# Patient Record
Sex: Male | Born: 1945 | Race: White | Hispanic: No | Marital: Single | Smoking: Never smoker
Health system: Southern US, Community
[De-identification: ages and names within clinical notes are randomized; demographics above are authoritative.]

---

## 2005-11-24 ENCOUNTER — Encounter: Admission: RE | Admit: 2005-11-24 | Discharge: 2005-11-24 | Payer: Self-pay | Admitting: Orthopaedic Surgery

## 2011-12-31 DIAGNOSIS — Z23 Encounter for immunization: Secondary | ICD-10-CM | POA: Diagnosis not present

## 2012-01-20 DIAGNOSIS — M7989 Other specified soft tissue disorders: Secondary | ICD-10-CM | POA: Diagnosis not present

## 2012-01-21 ENCOUNTER — Other Ambulatory Visit: Payer: Self-pay | Admitting: Family Medicine

## 2012-01-21 DIAGNOSIS — M7989 Other specified soft tissue disorders: Secondary | ICD-10-CM

## 2012-01-22 ENCOUNTER — Ambulatory Visit
Admission: RE | Admit: 2012-01-22 | Discharge: 2012-01-22 | Disposition: A | Discharge status: Home / Self Care | Payer: Medicare Other | Source: Ambulatory Visit | Attending: Family Medicine | Admitting: Family Medicine

## 2012-01-22 DIAGNOSIS — M7989 Other specified soft tissue disorders: Secondary | ICD-10-CM

## 2012-01-22 DIAGNOSIS — I771 Stricture of artery: Secondary | ICD-10-CM | POA: Diagnosis not present

## 2012-02-09 DIAGNOSIS — Z131 Encounter for screening for diabetes mellitus: Secondary | ICD-10-CM | POA: Diagnosis not present

## 2012-02-09 DIAGNOSIS — Z0000 Encounter for general adult medical examination without abnormal findings: Secondary | ICD-10-CM | POA: Diagnosis not present

## 2012-02-09 DIAGNOSIS — Z136 Encounter for screening for cardiovascular disorders: Secondary | ICD-10-CM | POA: Diagnosis not present

## 2012-02-09 DIAGNOSIS — Z23 Encounter for immunization: Secondary | ICD-10-CM | POA: Diagnosis not present

## 2012-02-09 DIAGNOSIS — Z125 Encounter for screening for malignant neoplasm of prostate: Secondary | ICD-10-CM | POA: Diagnosis not present

## 2012-02-16 DIAGNOSIS — Z1211 Encounter for screening for malignant neoplasm of colon: Secondary | ICD-10-CM | POA: Diagnosis not present

## 2012-10-12 DIAGNOSIS — H43819 Vitreous degeneration, unspecified eye: Secondary | ICD-10-CM | POA: Diagnosis not present

## 2012-12-15 DIAGNOSIS — Z23 Encounter for immunization: Secondary | ICD-10-CM | POA: Diagnosis not present

## 2013-03-16 DIAGNOSIS — Z1331 Encounter for screening for depression: Secondary | ICD-10-CM | POA: Diagnosis not present

## 2013-03-16 DIAGNOSIS — Z125 Encounter for screening for malignant neoplasm of prostate: Secondary | ICD-10-CM | POA: Diagnosis not present

## 2013-03-16 DIAGNOSIS — Z131 Encounter for screening for diabetes mellitus: Secondary | ICD-10-CM | POA: Diagnosis not present

## 2013-03-16 DIAGNOSIS — Z136 Encounter for screening for cardiovascular disorders: Secondary | ICD-10-CM | POA: Diagnosis not present

## 2013-03-16 DIAGNOSIS — Z0000 Encounter for general adult medical examination without abnormal findings: Secondary | ICD-10-CM | POA: Diagnosis not present

## 2013-03-31 DIAGNOSIS — R7301 Impaired fasting glucose: Secondary | ICD-10-CM | POA: Diagnosis not present

## 2013-04-29 IMAGING — US US EXTREM UP ARTERIAL SEG MULTIPLE*R*
2 series · 13 of 25 positions shown · non-contrast
Comparison: None.

CLINICAL DATA: Right arm swelling with pulsatile area involving the
distal aspect of the right arm, evaluate for
aneurysm/pseudoaneurysm

RIGHT UPPER EXTREMITY ARTERIAL DUPLEX SCAN
TECHNIQUE: Gray-scale sonography as well as color Doppler and
duplex ultrasound was performed to evaluate the arteries of the
right upper extremity.

[Series 1: us extrem up arterial seg multiple*right* · 12 of 59 slices shown (1 of 2)]
[im 1/59]
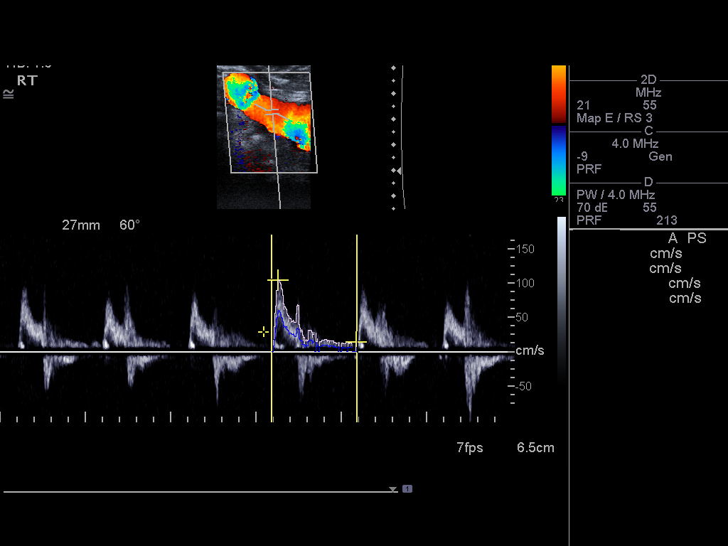
[im 6/59]
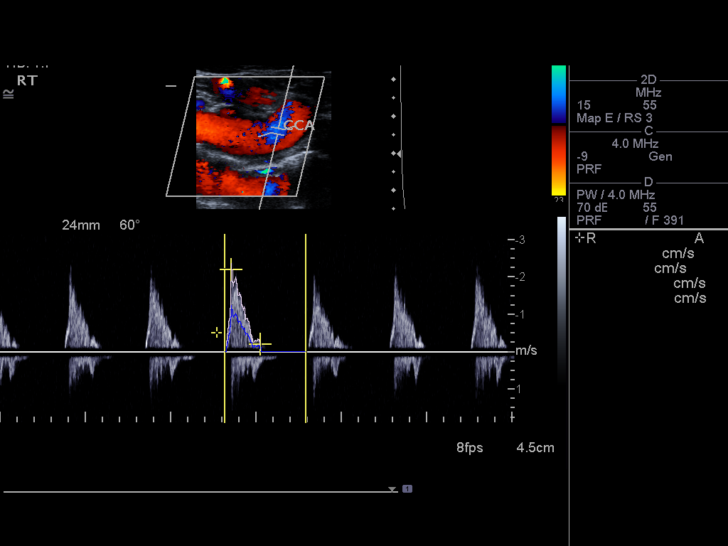
[im 11/59]
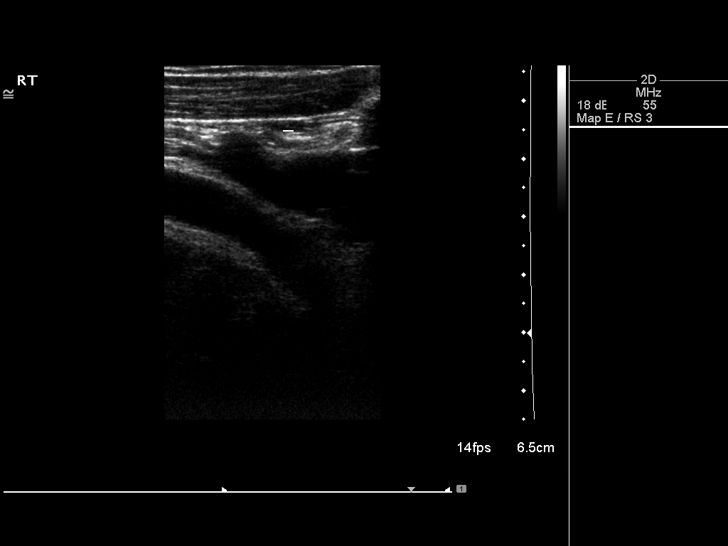
[im 16/59]
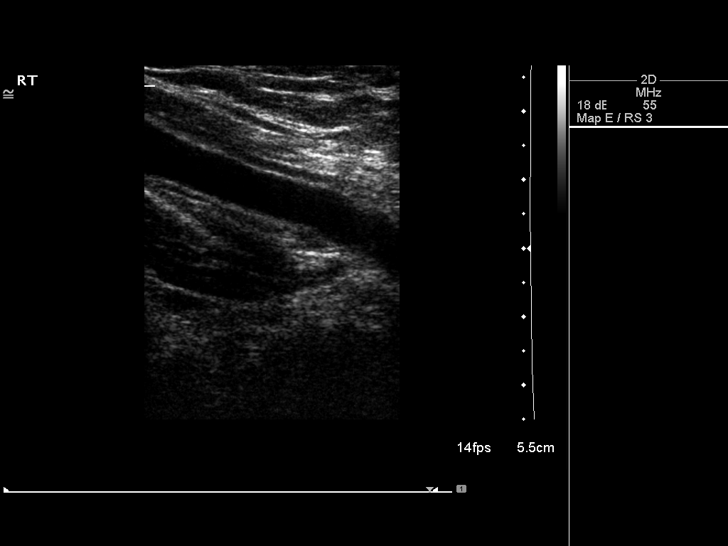
[im 21/59]
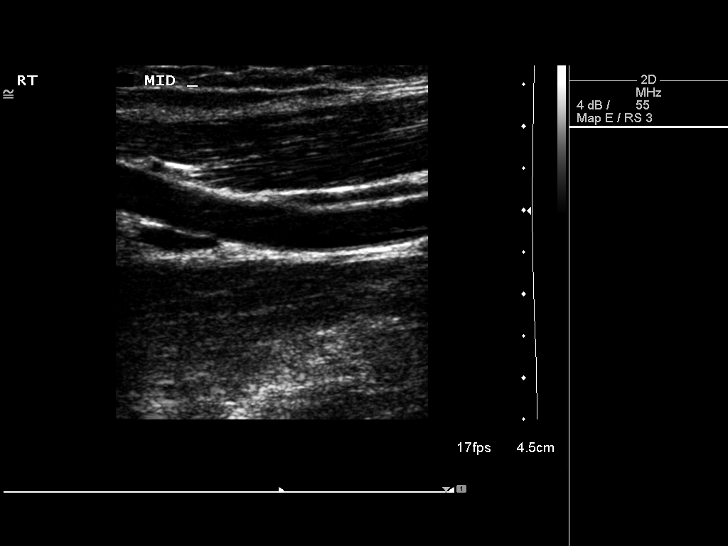
[im 26/59]
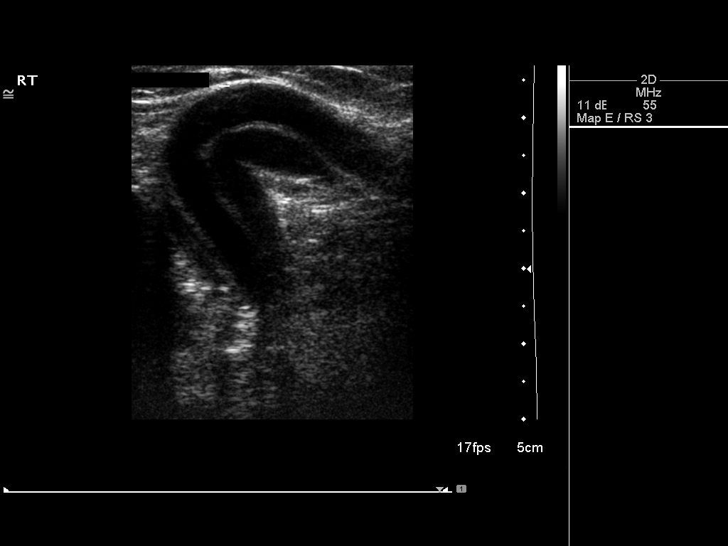
[im 31/59]
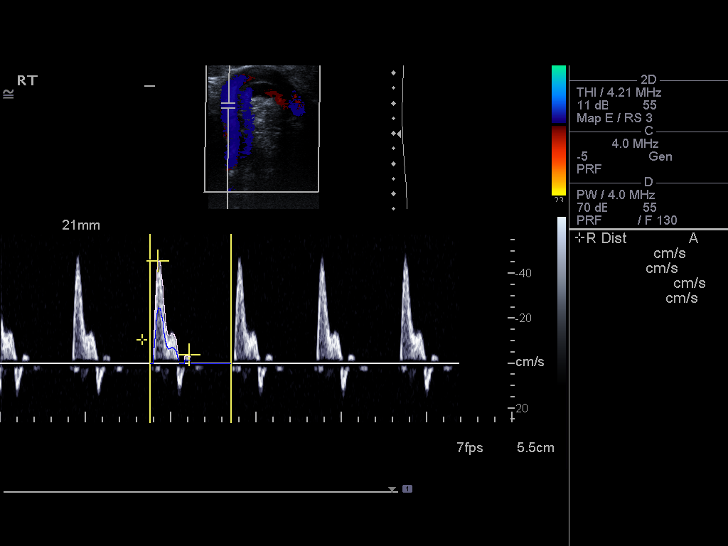
[im 36/59]
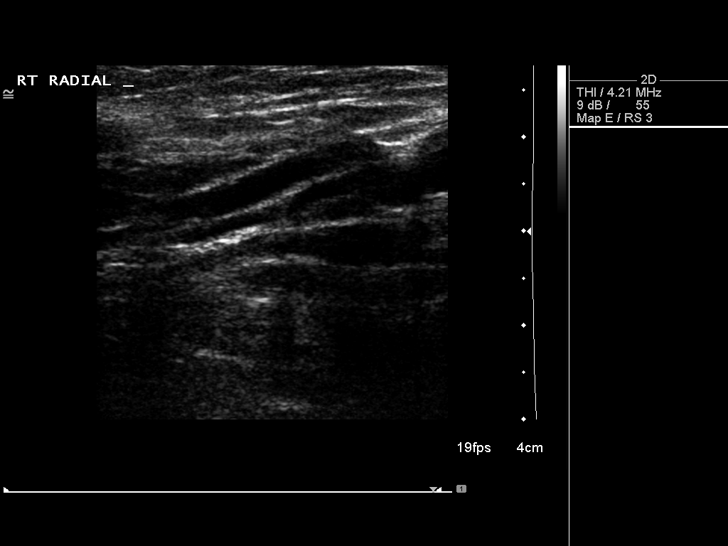
[im 41/59]
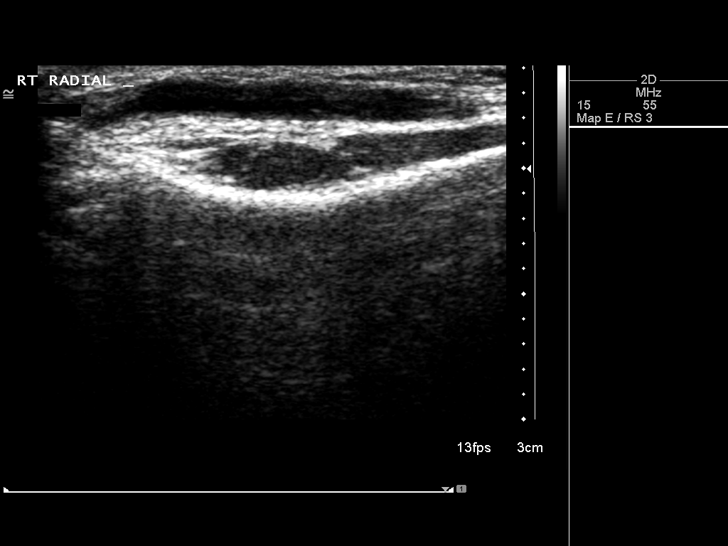
[im 46/59]
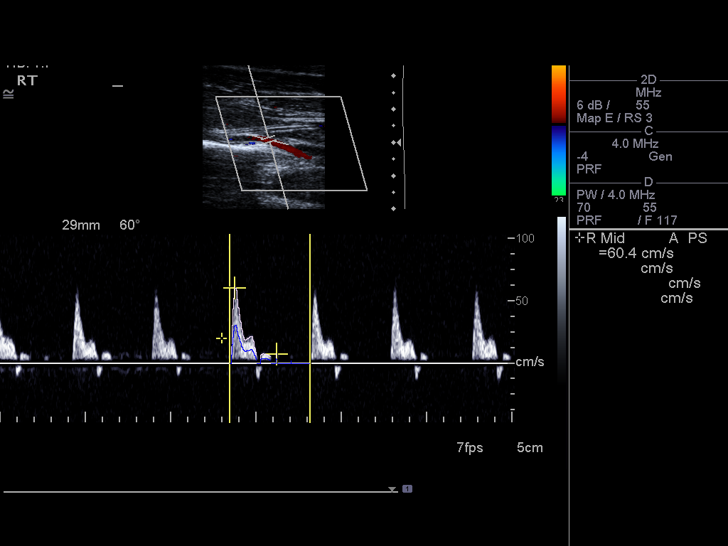
[im 51/59]
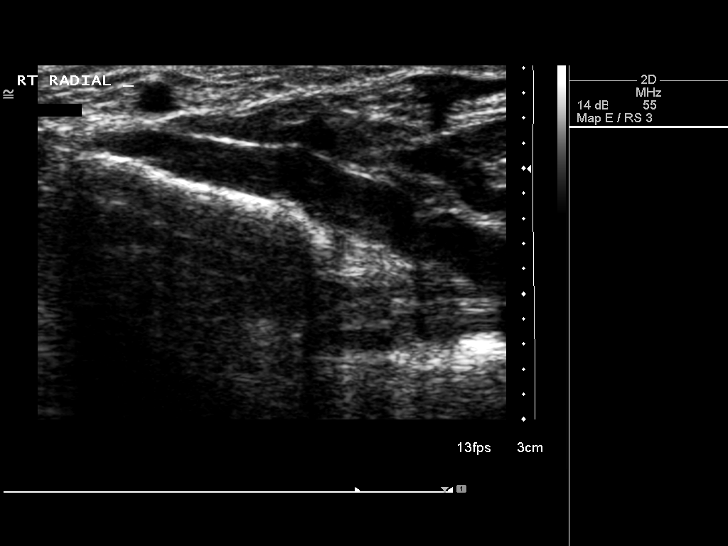
[im 56/59]
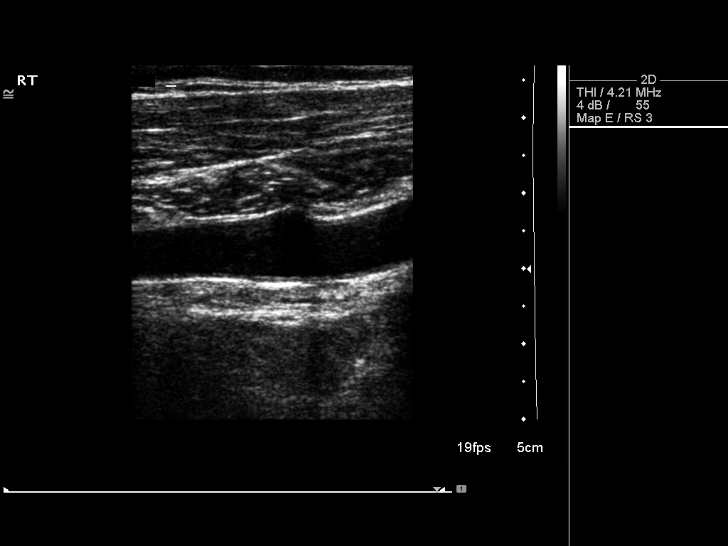

[Series 2: us extrem up arterial seg multiple*right* · 1 of 1 slices shown (2 of 2)]
[im 1/1]
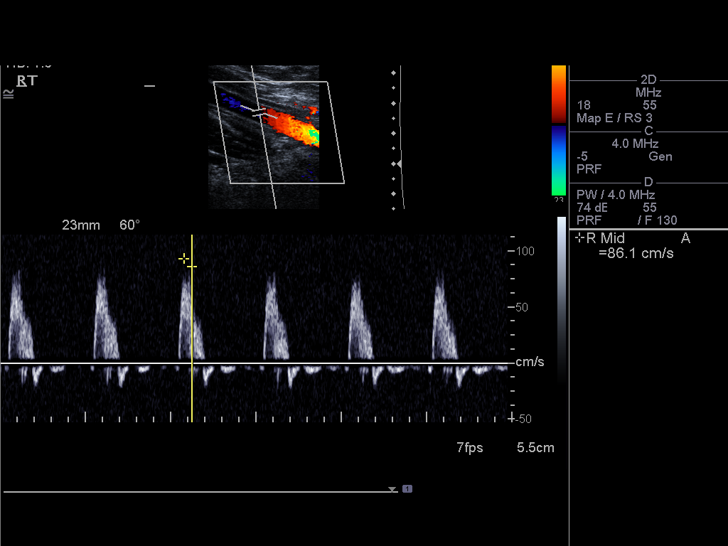

[13 of 25 positions shown; findings below may reference images not displayed]

FINDINGS: Triphasic wave forms are demonstrated throughout the entirety of
the irrigated portion of the right upper extremity beginning at the
level of the right subclavian artery.

Interrogated portions of the right upper extremity arteries are of
normal size.

There is tortuosity of the distal aspect of the brachial artery (at
the patient's palpable pulsatile area of concern), however the
vessel size is normal at this location.

No definitive evidence of vessel irregularity or dissection.
IMPRESSION: 1.  Normal Doppler ultrasound of the right upper extremity.
2.  Tortuosity of the distal aspect of the right brachial artery
correlates with the patient's palpable, pulsatile area of concern,
however the vessel is of normal caliber at this location.

## 2013-10-03 DIAGNOSIS — D485 Neoplasm of uncertain behavior of skin: Secondary | ICD-10-CM | POA: Diagnosis not present

## 2013-10-03 DIAGNOSIS — H6120 Impacted cerumen, unspecified ear: Secondary | ICD-10-CM | POA: Diagnosis not present

## 2013-10-06 DIAGNOSIS — L820 Inflamed seborrheic keratosis: Secondary | ICD-10-CM | POA: Diagnosis not present

## 2014-01-04 DIAGNOSIS — Z23 Encounter for immunization: Secondary | ICD-10-CM | POA: Diagnosis not present

## 2014-04-10 DIAGNOSIS — Z125 Encounter for screening for malignant neoplasm of prostate: Secondary | ICD-10-CM | POA: Diagnosis not present

## 2014-04-10 DIAGNOSIS — Z23 Encounter for immunization: Secondary | ICD-10-CM | POA: Diagnosis not present

## 2014-04-10 DIAGNOSIS — Z136 Encounter for screening for cardiovascular disorders: Secondary | ICD-10-CM | POA: Diagnosis not present

## 2014-04-10 DIAGNOSIS — Z0000 Encounter for general adult medical examination without abnormal findings: Secondary | ICD-10-CM | POA: Diagnosis not present

## 2014-04-10 DIAGNOSIS — D229 Melanocytic nevi, unspecified: Secondary | ICD-10-CM | POA: Diagnosis not present

## 2014-04-10 DIAGNOSIS — Z131 Encounter for screening for diabetes mellitus: Secondary | ICD-10-CM | POA: Diagnosis not present

## 2014-04-14 DIAGNOSIS — Z1211 Encounter for screening for malignant neoplasm of colon: Secondary | ICD-10-CM | POA: Diagnosis not present

## 2014-04-14 DIAGNOSIS — Z0000 Encounter for general adult medical examination without abnormal findings: Secondary | ICD-10-CM | POA: Diagnosis not present

## 2014-05-10 DIAGNOSIS — D225 Melanocytic nevi of trunk: Secondary | ICD-10-CM | POA: Diagnosis not present

## 2014-05-10 DIAGNOSIS — L814 Other melanin hyperpigmentation: Secondary | ICD-10-CM | POA: Diagnosis not present

## 2014-05-10 DIAGNOSIS — L821 Other seborrheic keratosis: Secondary | ICD-10-CM | POA: Diagnosis not present

## 2014-07-18 DIAGNOSIS — L821 Other seborrheic keratosis: Secondary | ICD-10-CM | POA: Diagnosis not present

## 2014-10-16 DIAGNOSIS — H01001 Unspecified blepharitis right upper eyelid: Secondary | ICD-10-CM | POA: Diagnosis not present

## 2014-10-16 DIAGNOSIS — H01004 Unspecified blepharitis left upper eyelid: Secondary | ICD-10-CM | POA: Diagnosis not present

## 2015-01-08 DIAGNOSIS — H01009 Unspecified blepharitis unspecified eye, unspecified eyelid: Secondary | ICD-10-CM | POA: Diagnosis not present

## 2015-01-11 DIAGNOSIS — H0011 Chalazion right upper eyelid: Secondary | ICD-10-CM | POA: Diagnosis not present

## 2015-02-12 DIAGNOSIS — H00032 Abscess of right lower eyelid: Secondary | ICD-10-CM | POA: Diagnosis not present

## 2015-03-07 DIAGNOSIS — H00032 Abscess of right lower eyelid: Secondary | ICD-10-CM | POA: Diagnosis not present

## 2015-06-14 DIAGNOSIS — H00034 Abscess of left upper eyelid: Secondary | ICD-10-CM | POA: Diagnosis not present

## 2015-06-28 DIAGNOSIS — D485 Neoplasm of uncertain behavior of skin: Secondary | ICD-10-CM | POA: Diagnosis not present

## 2015-06-28 DIAGNOSIS — L812 Freckles: Secondary | ICD-10-CM | POA: Diagnosis not present

## 2015-06-28 DIAGNOSIS — L570 Actinic keratosis: Secondary | ICD-10-CM | POA: Diagnosis not present

## 2015-06-28 DIAGNOSIS — D235 Other benign neoplasm of skin of trunk: Secondary | ICD-10-CM | POA: Diagnosis not present

## 2015-06-28 DIAGNOSIS — L821 Other seborrheic keratosis: Secondary | ICD-10-CM | POA: Diagnosis not present

## 2015-06-28 DIAGNOSIS — D1801 Hemangioma of skin and subcutaneous tissue: Secondary | ICD-10-CM | POA: Diagnosis not present

## 2015-06-28 DIAGNOSIS — D0339 Melanoma in situ of other parts of face: Secondary | ICD-10-CM | POA: Diagnosis not present

## 2015-07-09 DIAGNOSIS — H0014 Chalazion left upper eyelid: Secondary | ICD-10-CM | POA: Diagnosis not present

## 2015-07-19 DIAGNOSIS — E7800 Pure hypercholesterolemia, unspecified: Secondary | ICD-10-CM | POA: Diagnosis not present

## 2015-07-19 DIAGNOSIS — Z125 Encounter for screening for malignant neoplasm of prostate: Secondary | ICD-10-CM | POA: Diagnosis not present

## 2015-07-19 DIAGNOSIS — Z0000 Encounter for general adult medical examination without abnormal findings: Secondary | ICD-10-CM | POA: Diagnosis not present

## 2015-07-19 DIAGNOSIS — Z131 Encounter for screening for diabetes mellitus: Secondary | ICD-10-CM | POA: Diagnosis not present

## 2015-07-26 DIAGNOSIS — Z1211 Encounter for screening for malignant neoplasm of colon: Secondary | ICD-10-CM | POA: Diagnosis not present

## 2015-08-16 DIAGNOSIS — D2239 Melanocytic nevi of other parts of face: Secondary | ICD-10-CM | POA: Diagnosis not present

## 2015-08-16 DIAGNOSIS — D0339 Melanoma in situ of other parts of face: Secondary | ICD-10-CM | POA: Diagnosis not present

## 2015-10-23 DIAGNOSIS — H43393 Other vitreous opacities, bilateral: Secondary | ICD-10-CM | POA: Diagnosis not present

## 2015-12-11 DIAGNOSIS — Z23 Encounter for immunization: Secondary | ICD-10-CM | POA: Diagnosis not present

## 2016-04-11 DIAGNOSIS — L723 Sebaceous cyst: Secondary | ICD-10-CM | POA: Diagnosis not present

## 2016-06-19 DIAGNOSIS — D1800 Hemangioma unspecified site: Secondary | ICD-10-CM | POA: Diagnosis not present

## 2016-06-19 DIAGNOSIS — L821 Other seborrheic keratosis: Secondary | ICD-10-CM | POA: Diagnosis not present

## 2016-06-19 DIAGNOSIS — L814 Other melanin hyperpigmentation: Secondary | ICD-10-CM | POA: Diagnosis not present

## 2016-06-19 DIAGNOSIS — Z85820 Personal history of malignant melanoma of skin: Secondary | ICD-10-CM | POA: Diagnosis not present

## 2016-07-24 DIAGNOSIS — E7800 Pure hypercholesterolemia, unspecified: Secondary | ICD-10-CM | POA: Diagnosis not present

## 2016-07-24 DIAGNOSIS — R209 Unspecified disturbances of skin sensation: Secondary | ICD-10-CM | POA: Diagnosis not present

## 2016-07-24 DIAGNOSIS — Z0000 Encounter for general adult medical examination without abnormal findings: Secondary | ICD-10-CM | POA: Diagnosis not present

## 2016-07-24 DIAGNOSIS — Z125 Encounter for screening for malignant neoplasm of prostate: Secondary | ICD-10-CM | POA: Diagnosis not present

## 2016-07-24 DIAGNOSIS — Z1159 Encounter for screening for other viral diseases: Secondary | ICD-10-CM | POA: Diagnosis not present

## 2016-07-31 DIAGNOSIS — Z0000 Encounter for general adult medical examination without abnormal findings: Secondary | ICD-10-CM | POA: Diagnosis not present

## 2016-07-31 DIAGNOSIS — Z125 Encounter for screening for malignant neoplasm of prostate: Secondary | ICD-10-CM | POA: Diagnosis not present

## 2016-10-28 DIAGNOSIS — H2513 Age-related nuclear cataract, bilateral: Secondary | ICD-10-CM | POA: Diagnosis not present

## 2016-12-18 DIAGNOSIS — L821 Other seborrheic keratosis: Secondary | ICD-10-CM | POA: Diagnosis not present

## 2016-12-18 DIAGNOSIS — D229 Melanocytic nevi, unspecified: Secondary | ICD-10-CM | POA: Diagnosis not present

## 2016-12-18 DIAGNOSIS — L814 Other melanin hyperpigmentation: Secondary | ICD-10-CM | POA: Diagnosis not present

## 2016-12-18 DIAGNOSIS — Z85820 Personal history of malignant melanoma of skin: Secondary | ICD-10-CM | POA: Diagnosis not present

## 2016-12-18 DIAGNOSIS — L820 Inflamed seborrheic keratosis: Secondary | ICD-10-CM | POA: Diagnosis not present

## 2016-12-25 DIAGNOSIS — Z23 Encounter for immunization: Secondary | ICD-10-CM | POA: Diagnosis not present

## 2017-06-26 DIAGNOSIS — B359 Dermatophytosis, unspecified: Secondary | ICD-10-CM | POA: Diagnosis not present

## 2017-07-09 DIAGNOSIS — D225 Melanocytic nevi of trunk: Secondary | ICD-10-CM | POA: Diagnosis not present

## 2017-07-09 DIAGNOSIS — Z85820 Personal history of malignant melanoma of skin: Secondary | ICD-10-CM | POA: Diagnosis not present

## 2017-07-09 DIAGNOSIS — L821 Other seborrheic keratosis: Secondary | ICD-10-CM | POA: Diagnosis not present

## 2017-07-09 DIAGNOSIS — L814 Other melanin hyperpigmentation: Secondary | ICD-10-CM | POA: Diagnosis not present

## 2017-07-30 DIAGNOSIS — Z79899 Other long term (current) drug therapy: Secondary | ICD-10-CM | POA: Diagnosis not present

## 2017-07-30 DIAGNOSIS — Z0000 Encounter for general adult medical examination without abnormal findings: Secondary | ICD-10-CM | POA: Diagnosis not present

## 2017-07-30 DIAGNOSIS — E7800 Pure hypercholesterolemia, unspecified: Secondary | ICD-10-CM | POA: Diagnosis not present

## 2017-07-30 DIAGNOSIS — Z1211 Encounter for screening for malignant neoplasm of colon: Secondary | ICD-10-CM | POA: Diagnosis not present

## 2017-07-31 DIAGNOSIS — Z1211 Encounter for screening for malignant neoplasm of colon: Secondary | ICD-10-CM | POA: Diagnosis not present

## 2017-10-27 DIAGNOSIS — H2513 Age-related nuclear cataract, bilateral: Secondary | ICD-10-CM | POA: Diagnosis not present

## 2017-12-08 DIAGNOSIS — Z23 Encounter for immunization: Secondary | ICD-10-CM | POA: Diagnosis not present

## 2018-01-11 DIAGNOSIS — Z85820 Personal history of malignant melanoma of skin: Secondary | ICD-10-CM | POA: Diagnosis not present

## 2018-01-11 DIAGNOSIS — L570 Actinic keratosis: Secondary | ICD-10-CM | POA: Diagnosis not present

## 2018-01-11 DIAGNOSIS — D225 Melanocytic nevi of trunk: Secondary | ICD-10-CM | POA: Diagnosis not present

## 2018-01-11 DIAGNOSIS — D1801 Hemangioma of skin and subcutaneous tissue: Secondary | ICD-10-CM | POA: Diagnosis not present

## 2018-06-23 DIAGNOSIS — H00034 Abscess of left upper eyelid: Secondary | ICD-10-CM | POA: Diagnosis not present

## 2018-07-15 DIAGNOSIS — R238 Other skin changes: Secondary | ICD-10-CM | POA: Diagnosis not present

## 2018-07-15 DIAGNOSIS — D225 Melanocytic nevi of trunk: Secondary | ICD-10-CM | POA: Diagnosis not present

## 2018-07-15 DIAGNOSIS — L570 Actinic keratosis: Secondary | ICD-10-CM | POA: Diagnosis not present

## 2018-07-15 DIAGNOSIS — L821 Other seborrheic keratosis: Secondary | ICD-10-CM | POA: Diagnosis not present

## 2018-07-15 DIAGNOSIS — Z85820 Personal history of malignant melanoma of skin: Secondary | ICD-10-CM | POA: Diagnosis not present

## 2018-07-15 DIAGNOSIS — D1801 Hemangioma of skin and subcutaneous tissue: Secondary | ICD-10-CM | POA: Diagnosis not present

## 2018-07-15 DIAGNOSIS — D485 Neoplasm of uncertain behavior of skin: Secondary | ICD-10-CM | POA: Diagnosis not present

## 2018-08-25 DIAGNOSIS — Z79899 Other long term (current) drug therapy: Secondary | ICD-10-CM | POA: Diagnosis not present

## 2018-08-25 DIAGNOSIS — E7800 Pure hypercholesterolemia, unspecified: Secondary | ICD-10-CM | POA: Diagnosis not present

## 2018-08-25 DIAGNOSIS — Z0001 Encounter for general adult medical examination with abnormal findings: Secondary | ICD-10-CM | POA: Diagnosis not present

## 2018-09-01 DIAGNOSIS — E7800 Pure hypercholesterolemia, unspecified: Secondary | ICD-10-CM | POA: Diagnosis not present

## 2018-09-01 DIAGNOSIS — Z0001 Encounter for general adult medical examination with abnormal findings: Secondary | ICD-10-CM | POA: Diagnosis not present

## 2018-09-01 DIAGNOSIS — Z79899 Other long term (current) drug therapy: Secondary | ICD-10-CM | POA: Diagnosis not present

## 2018-10-29 DIAGNOSIS — H2513 Age-related nuclear cataract, bilateral: Secondary | ICD-10-CM | POA: Diagnosis not present

## 2018-10-29 DIAGNOSIS — H40053 Ocular hypertension, bilateral: Secondary | ICD-10-CM | POA: Diagnosis not present

## 2018-10-29 DIAGNOSIS — H5203 Hypermetropia, bilateral: Secondary | ICD-10-CM | POA: Diagnosis not present

## 2018-12-03 DIAGNOSIS — Z23 Encounter for immunization: Secondary | ICD-10-CM | POA: Diagnosis not present

## 2019-01-17 DIAGNOSIS — L570 Actinic keratosis: Secondary | ICD-10-CM | POA: Diagnosis not present

## 2019-01-17 DIAGNOSIS — D2271 Melanocytic nevi of right lower limb, including hip: Secondary | ICD-10-CM | POA: Diagnosis not present

## 2019-01-17 DIAGNOSIS — D1801 Hemangioma of skin and subcutaneous tissue: Secondary | ICD-10-CM | POA: Diagnosis not present

## 2019-01-17 DIAGNOSIS — D485 Neoplasm of uncertain behavior of skin: Secondary | ICD-10-CM | POA: Diagnosis not present

## 2019-01-17 DIAGNOSIS — Z85820 Personal history of malignant melanoma of skin: Secondary | ICD-10-CM | POA: Diagnosis not present

## 2019-01-17 DIAGNOSIS — R238 Other skin changes: Secondary | ICD-10-CM | POA: Diagnosis not present

## 2019-01-17 DIAGNOSIS — L821 Other seborrheic keratosis: Secondary | ICD-10-CM | POA: Diagnosis not present

## 2019-01-17 DIAGNOSIS — C44519 Basal cell carcinoma of skin of other part of trunk: Secondary | ICD-10-CM | POA: Diagnosis not present

## 2019-01-31 DIAGNOSIS — C44519 Basal cell carcinoma of skin of other part of trunk: Secondary | ICD-10-CM | POA: Diagnosis not present

## 2019-04-17 ENCOUNTER — Ambulatory Visit: Payer: Medicare Other | Attending: Internal Medicine

## 2019-04-17 DIAGNOSIS — Z23 Encounter for immunization: Secondary | ICD-10-CM | POA: Insufficient documentation

## 2019-04-17 NOTE — Progress Notes (Signed)
   Covid-19 Vaccination Clinic  Name:  Shane Lin    MRN: [id number] DOB: Jul 30, 1945  04/17/2019  Shane Lin was observed post Covid-19 immunization for 15 minutes without incidence. He was provided with Vaccine Information Sheet and instruction to access the V-Safe system.   Shane Lin was instructed to call 911 with any severe reactions post vaccine: Marland Kitchen Difficulty breathing  . Swelling of your face and throat  . A fast heartbeat  . A bad rash all over your body  . Dizziness and weakness    Immunizations Administered    Name Date Dose VIS Date Route   Pfizer COVID-19 Vaccine 04/17/2019  2:19 PM 0.3 mL 02/11/2019 Intramuscular   Manufacturer: [place]   Lot: [id number]   [place]: [id number]

## 2019-04-19 DIAGNOSIS — M62830 Muscle spasm of back: Secondary | ICD-10-CM | POA: Diagnosis not present

## 2019-04-19 DIAGNOSIS — R1912 Hyperactive bowel sounds: Secondary | ICD-10-CM | POA: Diagnosis not present

## 2019-05-02 DIAGNOSIS — L821 Other seborrheic keratosis: Secondary | ICD-10-CM | POA: Diagnosis not present

## 2019-05-02 DIAGNOSIS — Z85828 Personal history of other malignant neoplasm of skin: Secondary | ICD-10-CM | POA: Diagnosis not present

## 2019-05-02 DIAGNOSIS — L905 Scar conditions and fibrosis of skin: Secondary | ICD-10-CM | POA: Diagnosis not present

## 2019-05-10 ENCOUNTER — Ambulatory Visit: Payer: Medicare Other | Attending: Internal Medicine

## 2019-05-10 DIAGNOSIS — Z23 Encounter for immunization: Secondary | ICD-10-CM | POA: Insufficient documentation

## 2019-05-10 NOTE — Progress Notes (Signed)
   Covid-19 Vaccination Clinic  Name:  Shane Lin    MRN: [id number] DOB: 07-13-1945  05/10/2019  Mr. Shane Lin was observed post Covid-19 immunization for 15 minutes without incident. He was provided with Vaccine Information Sheet and instruction to access the V-Safe system.   Mr. Shane Lin was instructed to call 911 with any severe reactions post vaccine: Marland Kitchen Difficulty breathing  . Swelling of face and throat  . A fast heartbeat  . A bad rash all over body  . Dizziness and weakness   Immunizations Administered    Name Date Dose VIS Date Route   Pfizer COVID-19 Vaccine 05/10/2019  8:20 AM 0.3 mL 02/11/2019 Intramuscular   Manufacturer: [place]   Lot: [id number]   [place]: [id number]

## 2019-05-11 ENCOUNTER — Ambulatory Visit: Payer: PRIVATE HEALTH INSURANCE

## 2019-05-19 DIAGNOSIS — H6121 Impacted cerumen, right ear: Secondary | ICD-10-CM | POA: Diagnosis not present

## 2019-05-19 DIAGNOSIS — M62830 Muscle spasm of back: Secondary | ICD-10-CM | POA: Diagnosis not present

## 2019-07-18 DIAGNOSIS — D1801 Hemangioma of skin and subcutaneous tissue: Secondary | ICD-10-CM | POA: Diagnosis not present

## 2019-07-18 DIAGNOSIS — Z85820 Personal history of malignant melanoma of skin: Secondary | ICD-10-CM | POA: Diagnosis not present

## 2019-07-18 DIAGNOSIS — L700 Acne vulgaris: Secondary | ICD-10-CM | POA: Diagnosis not present

## 2019-07-18 DIAGNOSIS — L905 Scar conditions and fibrosis of skin: Secondary | ICD-10-CM | POA: Diagnosis not present

## 2019-07-18 DIAGNOSIS — L821 Other seborrheic keratosis: Secondary | ICD-10-CM | POA: Diagnosis not present

## 2019-07-18 DIAGNOSIS — D225 Melanocytic nevi of trunk: Secondary | ICD-10-CM | POA: Diagnosis not present

## 2019-08-29 DIAGNOSIS — E7800 Pure hypercholesterolemia, unspecified: Secondary | ICD-10-CM | POA: Diagnosis not present

## 2019-08-29 DIAGNOSIS — H6121 Impacted cerumen, right ear: Secondary | ICD-10-CM | POA: Diagnosis not present

## 2019-08-29 DIAGNOSIS — Z79899 Other long term (current) drug therapy: Secondary | ICD-10-CM | POA: Diagnosis not present

## 2019-08-29 DIAGNOSIS — Z0000 Encounter for general adult medical examination without abnormal findings: Secondary | ICD-10-CM | POA: Diagnosis not present

## 2019-08-29 DIAGNOSIS — Z1211 Encounter for screening for malignant neoplasm of colon: Secondary | ICD-10-CM | POA: Diagnosis not present

## 2019-10-31 DIAGNOSIS — H401131 Primary open-angle glaucoma, bilateral, mild stage: Secondary | ICD-10-CM | POA: Diagnosis not present

## 2019-10-31 DIAGNOSIS — H5203 Hypermetropia, bilateral: Secondary | ICD-10-CM | POA: Diagnosis not present

## 2019-10-31 DIAGNOSIS — H2513 Age-related nuclear cataract, bilateral: Secondary | ICD-10-CM | POA: Diagnosis not present

## 2019-10-31 DIAGNOSIS — H52203 Unspecified astigmatism, bilateral: Secondary | ICD-10-CM | POA: Diagnosis not present

## 2019-11-21 DIAGNOSIS — H401131 Primary open-angle glaucoma, bilateral, mild stage: Secondary | ICD-10-CM | POA: Diagnosis not present

## 2019-11-24 DIAGNOSIS — Z23 Encounter for immunization: Secondary | ICD-10-CM | POA: Diagnosis not present

## 2019-12-17 ENCOUNTER — Ambulatory Visit: Payer: Medicare Other | Attending: Internal Medicine

## 2019-12-17 DIAGNOSIS — Z23 Encounter for immunization: Secondary | ICD-10-CM

## 2019-12-17 NOTE — Progress Notes (Signed)
   Covid-19 Vaccination Clinic  Name:  Shane Lin    MRN: [id number] DOB: May 08, 1945  12/17/2019  [name] was observed post Covid-19 immunization for 15 minutes without incident. He was provided with Vaccine Information Sheet and instruction to access the V-Safe system.   [name] was instructed to call 911 with any severe reactions post vaccine: Marland Kitchen Difficulty breathing  . Swelling of face and throat  . A fast heartbeat  . A bad rash all over body  . Dizziness and weakness

## 2020-03-22 DIAGNOSIS — H2513 Age-related nuclear cataract, bilateral: Secondary | ICD-10-CM | POA: Diagnosis not present

## 2020-03-22 DIAGNOSIS — H401131 Primary open-angle glaucoma, bilateral, mild stage: Secondary | ICD-10-CM | POA: Diagnosis not present

## 2020-06-22 DIAGNOSIS — Z23 Encounter for immunization: Secondary | ICD-10-CM | POA: Diagnosis not present

## 2020-07-17 DIAGNOSIS — L821 Other seborrheic keratosis: Secondary | ICD-10-CM | POA: Diagnosis not present

## 2020-07-17 DIAGNOSIS — Z85820 Personal history of malignant melanoma of skin: Secondary | ICD-10-CM | POA: Diagnosis not present

## 2020-07-17 DIAGNOSIS — D1801 Hemangioma of skin and subcutaneous tissue: Secondary | ICD-10-CM | POA: Diagnosis not present

## 2020-07-17 DIAGNOSIS — L720 Epidermal cyst: Secondary | ICD-10-CM | POA: Diagnosis not present

## 2020-07-17 DIAGNOSIS — I8393 Asymptomatic varicose veins of bilateral lower extremities: Secondary | ICD-10-CM | POA: Diagnosis not present

## 2020-07-17 DIAGNOSIS — L814 Other melanin hyperpigmentation: Secondary | ICD-10-CM | POA: Diagnosis not present

## 2020-07-17 DIAGNOSIS — L819 Disorder of pigmentation, unspecified: Secondary | ICD-10-CM | POA: Diagnosis not present

## 2020-07-17 DIAGNOSIS — Z85828 Personal history of other malignant neoplasm of skin: Secondary | ICD-10-CM | POA: Diagnosis not present

## 2020-07-17 DIAGNOSIS — L905 Scar conditions and fibrosis of skin: Secondary | ICD-10-CM | POA: Diagnosis not present

## 2020-07-20 DIAGNOSIS — H401131 Primary open-angle glaucoma, bilateral, mild stage: Secondary | ICD-10-CM | POA: Diagnosis not present

## 2020-07-20 DIAGNOSIS — H2513 Age-related nuclear cataract, bilateral: Secondary | ICD-10-CM | POA: Diagnosis not present

## 2020-09-04 DIAGNOSIS — R7309 Other abnormal glucose: Secondary | ICD-10-CM | POA: Diagnosis not present

## 2020-09-04 DIAGNOSIS — Z79899 Other long term (current) drug therapy: Secondary | ICD-10-CM | POA: Diagnosis not present

## 2020-09-04 DIAGNOSIS — E7800 Pure hypercholesterolemia, unspecified: Secondary | ICD-10-CM | POA: Diagnosis not present

## 2020-09-06 DIAGNOSIS — Z0000 Encounter for general adult medical examination without abnormal findings: Secondary | ICD-10-CM | POA: Diagnosis not present

## 2020-09-06 DIAGNOSIS — E7800 Pure hypercholesterolemia, unspecified: Secondary | ICD-10-CM | POA: Diagnosis not present

## 2020-09-06 DIAGNOSIS — R279 Unspecified lack of coordination: Secondary | ICD-10-CM | POA: Diagnosis not present

## 2020-09-06 DIAGNOSIS — Z79899 Other long term (current) drug therapy: Secondary | ICD-10-CM | POA: Diagnosis not present

## 2020-09-06 DIAGNOSIS — R200 Anesthesia of skin: Secondary | ICD-10-CM | POA: Diagnosis not present

## 2020-09-06 DIAGNOSIS — Z1211 Encounter for screening for malignant neoplasm of colon: Secondary | ICD-10-CM | POA: Diagnosis not present

## 2020-09-18 ENCOUNTER — Other Ambulatory Visit: Payer: Self-pay | Admitting: Family Medicine

## 2020-09-18 DIAGNOSIS — R279 Unspecified lack of coordination: Secondary | ICD-10-CM

## 2020-09-18 DIAGNOSIS — R200 Anesthesia of skin: Secondary | ICD-10-CM

## 2020-10-01 DIAGNOSIS — R1013 Epigastric pain: Secondary | ICD-10-CM | POA: Diagnosis not present

## 2020-10-01 DIAGNOSIS — R195 Other fecal abnormalities: Secondary | ICD-10-CM | POA: Diagnosis not present

## 2020-10-01 DIAGNOSIS — R1310 Dysphagia, unspecified: Secondary | ICD-10-CM | POA: Diagnosis not present

## 2020-10-30 DIAGNOSIS — R1013 Epigastric pain: Secondary | ICD-10-CM | POA: Diagnosis not present

## 2020-10-30 DIAGNOSIS — D125 Benign neoplasm of sigmoid colon: Secondary | ICD-10-CM | POA: Diagnosis not present

## 2020-10-30 DIAGNOSIS — K5730 Diverticulosis of large intestine without perforation or abscess without bleeding: Secondary | ICD-10-CM | POA: Diagnosis not present

## 2020-10-30 DIAGNOSIS — R195 Other fecal abnormalities: Secondary | ICD-10-CM | POA: Diagnosis not present

## 2020-10-30 DIAGNOSIS — D123 Benign neoplasm of transverse colon: Secondary | ICD-10-CM | POA: Diagnosis not present

## 2020-11-01 DIAGNOSIS — D125 Benign neoplasm of sigmoid colon: Secondary | ICD-10-CM | POA: Diagnosis not present

## 2020-11-01 DIAGNOSIS — D123 Benign neoplasm of transverse colon: Secondary | ICD-10-CM | POA: Diagnosis not present

## 2020-11-16 DIAGNOSIS — Z23 Encounter for immunization: Secondary | ICD-10-CM | POA: Diagnosis not present

## 2020-11-21 DIAGNOSIS — H2513 Age-related nuclear cataract, bilateral: Secondary | ICD-10-CM | POA: Diagnosis not present

## 2020-11-21 DIAGNOSIS — H5203 Hypermetropia, bilateral: Secondary | ICD-10-CM | POA: Diagnosis not present

## 2020-11-21 DIAGNOSIS — H401131 Primary open-angle glaucoma, bilateral, mild stage: Secondary | ICD-10-CM | POA: Diagnosis not present

## 2020-11-21 DIAGNOSIS — H524 Presbyopia: Secondary | ICD-10-CM | POA: Diagnosis not present

## 2020-11-30 DIAGNOSIS — Z23 Encounter for immunization: Secondary | ICD-10-CM | POA: Diagnosis not present

## 2020-12-14 DIAGNOSIS — Z86010 Personal history of colonic polyps: Secondary | ICD-10-CM | POA: Diagnosis not present

## 2020-12-14 DIAGNOSIS — R1013 Epigastric pain: Secondary | ICD-10-CM | POA: Diagnosis not present

## 2021-03-26 DIAGNOSIS — H401131 Primary open-angle glaucoma, bilateral, mild stage: Secondary | ICD-10-CM | POA: Diagnosis not present

## 2021-03-26 DIAGNOSIS — H2513 Age-related nuclear cataract, bilateral: Secondary | ICD-10-CM | POA: Diagnosis not present

## 2021-05-22 DIAGNOSIS — H00012 Hordeolum externum right lower eyelid: Secondary | ICD-10-CM | POA: Diagnosis not present

## 2021-05-22 DIAGNOSIS — H0100B Unspecified blepharitis left eye, upper and lower eyelids: Secondary | ICD-10-CM | POA: Diagnosis not present

## 2021-05-22 DIAGNOSIS — H2513 Age-related nuclear cataract, bilateral: Secondary | ICD-10-CM | POA: Diagnosis not present

## 2021-05-22 DIAGNOSIS — H0100A Unspecified blepharitis right eye, upper and lower eyelids: Secondary | ICD-10-CM | POA: Diagnosis not present

## 2021-05-30 DIAGNOSIS — H0100A Unspecified blepharitis right eye, upper and lower eyelids: Secondary | ICD-10-CM | POA: Diagnosis not present

## 2021-05-30 DIAGNOSIS — H0015 Chalazion left lower eyelid: Secondary | ICD-10-CM | POA: Diagnosis not present

## 2021-05-30 DIAGNOSIS — H2513 Age-related nuclear cataract, bilateral: Secondary | ICD-10-CM | POA: Diagnosis not present

## 2021-05-30 DIAGNOSIS — H0100B Unspecified blepharitis left eye, upper and lower eyelids: Secondary | ICD-10-CM | POA: Diagnosis not present

## 2021-07-02 DIAGNOSIS — H2513 Age-related nuclear cataract, bilateral: Secondary | ICD-10-CM | POA: Diagnosis not present

## 2021-07-02 DIAGNOSIS — H0100A Unspecified blepharitis right eye, upper and lower eyelids: Secondary | ICD-10-CM | POA: Diagnosis not present

## 2021-07-17 DIAGNOSIS — Z23 Encounter for immunization: Secondary | ICD-10-CM | POA: Diagnosis not present

## 2021-07-23 DIAGNOSIS — H2513 Age-related nuclear cataract, bilateral: Secondary | ICD-10-CM | POA: Diagnosis not present

## 2021-07-23 DIAGNOSIS — H401131 Primary open-angle glaucoma, bilateral, mild stage: Secondary | ICD-10-CM | POA: Diagnosis not present

## 2021-07-23 DIAGNOSIS — H18593 Other hereditary corneal dystrophies, bilateral: Secondary | ICD-10-CM | POA: Diagnosis not present

## 2021-07-23 DIAGNOSIS — H43813 Vitreous degeneration, bilateral: Secondary | ICD-10-CM | POA: Diagnosis not present

## 2021-08-30 DIAGNOSIS — I10 Essential (primary) hypertension: Secondary | ICD-10-CM | POA: Diagnosis not present

## 2021-08-30 DIAGNOSIS — R110 Nausea: Secondary | ICD-10-CM | POA: Diagnosis not present

## 2021-08-30 DIAGNOSIS — M549 Dorsalgia, unspecified: Secondary | ICD-10-CM | POA: Diagnosis not present

## 2021-08-30 DIAGNOSIS — R9431 Abnormal electrocardiogram [ECG] [EKG]: Secondary | ICD-10-CM | POA: Diagnosis not present

## 2021-08-30 DIAGNOSIS — N12 Tubulo-interstitial nephritis, not specified as acute or chronic: Secondary | ICD-10-CM | POA: Diagnosis not present

## 2021-08-30 DIAGNOSIS — R61 Generalized hyperhidrosis: Secondary | ICD-10-CM | POA: Diagnosis not present

## 2021-08-31 ENCOUNTER — Emergency Department (HOSPITAL_COMMUNITY): Payer: Medicare Other

## 2021-08-31 ENCOUNTER — Encounter (HOSPITAL_COMMUNITY): Payer: Self-pay | Admitting: Emergency Medicine

## 2021-08-31 ENCOUNTER — Observation Stay (HOSPITAL_COMMUNITY)
Admission: EM | Admit: 2021-08-31 | Discharge: 2021-09-01 | Disposition: A | Discharge status: Home / Self Care | Payer: Medicare Other | Attending: Internal Medicine | Admitting: Internal Medicine

## 2021-08-31 ENCOUNTER — Other Ambulatory Visit: Payer: Self-pay

## 2021-08-31 DIAGNOSIS — N400 Enlarged prostate without lower urinary tract symptoms: Secondary | ICD-10-CM | POA: Diagnosis not present

## 2021-08-31 DIAGNOSIS — E872 Acidosis, unspecified: Secondary | ICD-10-CM | POA: Insufficient documentation

## 2021-08-31 DIAGNOSIS — A419 Sepsis, unspecified organism: Secondary | ICD-10-CM | POA: Diagnosis not present

## 2021-08-31 DIAGNOSIS — R6510 Systemic inflammatory response syndrome (SIRS) of non-infectious origin without acute organ dysfunction: Secondary | ICD-10-CM | POA: Diagnosis not present

## 2021-08-31 DIAGNOSIS — R9431 Abnormal electrocardiogram [ECG] [EKG]: Secondary | ICD-10-CM | POA: Diagnosis not present

## 2021-08-31 DIAGNOSIS — N12 Tubulo-interstitial nephritis, not specified as acute or chronic: Secondary | ICD-10-CM | POA: Diagnosis not present

## 2021-08-31 DIAGNOSIS — M47816 Spondylosis without myelopathy or radiculopathy, lumbar region: Secondary | ICD-10-CM | POA: Diagnosis not present

## 2021-08-31 DIAGNOSIS — R079 Chest pain, unspecified: Secondary | ICD-10-CM | POA: Diagnosis not present

## 2021-08-31 DIAGNOSIS — R110 Nausea: Secondary | ICD-10-CM | POA: Diagnosis not present

## 2021-08-31 DIAGNOSIS — R109 Unspecified abdominal pain: Secondary | ICD-10-CM | POA: Diagnosis present

## 2021-08-31 DIAGNOSIS — M47817 Spondylosis without myelopathy or radiculopathy, lumbosacral region: Secondary | ICD-10-CM | POA: Diagnosis not present

## 2021-08-31 DIAGNOSIS — K5730 Diverticulosis of large intestine without perforation or abscess without bleeding: Secondary | ICD-10-CM | POA: Diagnosis not present

## 2021-08-31 LAB — BASIC METABOLIC PANEL
Anion gap: 8 (ref 5–15)
BUN: 18 mg/dL (ref 8–23)
CO2: 23 mmol/L (ref 22–32)
Calcium: 8.8 mg/dL — ABNORMAL LOW (ref 8.9–10.3)
Chloride: 110 mmol/L (ref 98–111)
Creatinine, Ser: 1.14 mg/dL (ref 0.61–1.24)
GFR, Estimated: >60 mL/min (ref >60)
Glucose, Bld: 143 mg/dL — ABNORMAL HIGH (ref 70–99)
Potassium: 4.2 mmol/L (ref 3.5–5.1)
Sodium: 141 mmol/L (ref 135–145)

## 2021-08-31 LAB — URINALYSIS, ROUTINE W REFLEX MICROSCOPIC
Bilirubin Urine: NEGATIVE
Glucose, UA: NEGATIVE mg/dL
Hgb urine dipstick: NEGATIVE
Ketones, ur: NEGATIVE mg/dL
Leukocytes,Ua: NEGATIVE
Nitrite: NEGATIVE
Protein, ur: NEGATIVE mg/dL
Specific Gravity, Urine: 1.015 (ref 1.005–1.030)
pH: 5 (ref 5.0–8.0)

## 2021-08-31 LAB — TROPONIN I (HIGH SENSITIVITY)
Troponin I (High Sensitivity): 6 ng/L (ref <18)
Troponin I (High Sensitivity): 5 ng/L (ref <18)

## 2021-08-31 LAB — CBC WITH DIFFERENTIAL/PLATELET
Abs Immature Granulocytes: 0.13 10*3/uL — ABNORMAL HIGH (ref 0.00–0.07)
Basophils Absolute: 0.1 10*3/uL (ref 0.0–0.1)
Basophils Relative: 0 %
Eosinophils Absolute: 0 10*3/uL (ref 0.0–0.5)
Eosinophils Relative: 0 %
HCT: 44.6 % (ref 39.0–52.0)
Hemoglobin: 15.4 g/dL (ref 13.0–17.0)
Immature Granulocytes: 1 %
Lymphocytes Relative: 1 %
Lymphs Abs: 0.2 10*3/uL — ABNORMAL LOW (ref 0.7–4.0)
MCH: 32.8 pg (ref 26.0–34.0)
MCHC: 34.5 g/dL (ref 30.0–36.0)
MCV: 94.9 fL (ref 80.0–100.0)
Monocytes Absolute: 1.1 10*3/uL — ABNORMAL HIGH (ref 0.1–1.0)
Monocytes Relative: 6 %
Neutro Abs: 17.4 10*3/uL — ABNORMAL HIGH (ref 1.7–7.7)
Neutrophils Relative %: 92 %
Platelets: 241 10*3/uL (ref 150–400)
RBC: 4.7 MIL/uL (ref 4.22–5.81)
RDW: 12.2 % (ref 11.5–15.5)
WBC: 19 10*3/uL — ABNORMAL HIGH (ref 4.0–10.5)
nRBC: 0 % (ref 0.0–0.2)

## 2021-08-31 LAB — CULTURE, BLOOD (SINGLE): Special Requests: ADEQUATE

## 2021-08-31 LAB — LACTIC ACID, PLASMA
Lactic Acid, Venous: 2.4 mmol/L (ref 0.5–1.9)
Lactic Acid, Venous: 2.2 mmol/L (ref 0.5–1.9)
Lactic Acid, Venous: 1.8 mmol/L (ref 0.5–1.9)

## 2021-08-31 MED ORDER — LACTATED RINGERS IV SOLN: INTRAVENOUS | Status: DC

## 2021-08-31 MED ORDER — ONDANSETRON HCL 4 MG PO TABS: 4.0000 mg | ORAL_TABLET | Freq: Four times a day (QID) | ORAL | Status: DC | PRN

## 2021-08-31 MED ORDER — VANCOMYCIN HCL IN DEXTROSE 1-5 GM/200ML-% IV SOLN
1000.0000 mg | Freq: Once | INTRAVENOUS | Status: AC
  Administered 2021-08-31: 1000 mg via INTRAVENOUS
  Filled 2021-08-31: qty 200

## 2021-08-31 MED ORDER — ENOXAPARIN SODIUM 40 MG/0.4ML IJ SOSY
40.0000 mg | PREFILLED_SYRINGE | Freq: Every day | INTRAMUSCULAR | Status: DC
  Administered 2021-08-31 – 2021-09-01 (×2): 40 mg via SUBCUTANEOUS
  Filled 2021-08-31 (×2): qty 0.4

## 2021-08-31 MED ORDER — SENNOSIDES-DOCUSATE SODIUM 8.6-50 MG PO TABS: 1.0000 | ORAL_TABLET | Freq: Every evening | ORAL | Status: DC | PRN

## 2021-08-31 MED ORDER — SODIUM CHLORIDE 0.9 % IV SOLN
2.0000 g | Freq: Once | INTRAVENOUS | Status: AC
  Administered 2021-08-31: 2 g via INTRAVENOUS
  Filled 2021-08-31: qty 12.5

## 2021-08-31 MED ORDER — SODIUM CHLORIDE 0.9 % IV SOLN: 2.0000 g | Freq: Two times a day (BID) | INTRAVENOUS | Status: DC

## 2021-08-31 MED ORDER — ACETAMINOPHEN 650 MG RE SUPP: 650.0000 mg | Freq: Four times a day (QID) | RECTAL | Status: DC | PRN

## 2021-08-31 MED ORDER — VANCOMYCIN HCL IN DEXTROSE 1-5 GM/200ML-% IV SOLN: 1000.0000 mg | INTRAVENOUS | Status: DC

## 2021-08-31 MED ORDER — ACETAMINOPHEN 325 MG PO TABS
650.0000 mg | ORAL_TABLET | Freq: Four times a day (QID) | ORAL | Status: DC | PRN
  Administered 2021-08-31: 650 mg via ORAL
  Filled 2021-08-31 (×2): qty 2

## 2021-08-31 MED ORDER — LACTATED RINGERS IV BOLUS (SEPSIS)
1000.0000 mL | Freq: Once | INTRAVENOUS | Status: AC
  Administered 2021-08-31: 1000 mL via INTRAVENOUS

## 2021-08-31 MED ORDER — ONDANSETRON HCL 4 MG/2ML IJ SOLN: 4.0000 mg | Freq: Four times a day (QID) | INTRAMUSCULAR | Status: DC | PRN

## 2021-08-31 MED ORDER — SODIUM CHLORIDE 0.9 % IV SOLN
2.0000 g | INTRAVENOUS | Status: DC
  Administered 2021-08-31 – 2021-09-01 (×2): 2 g via INTRAVENOUS
  Filled 2021-08-31 (×3): qty 20

## 2021-08-31 MED ORDER — METRONIDAZOLE 500 MG/100ML IV SOLN
500.0000 mg | Freq: Once | INTRAVENOUS | Status: AC
  Administered 2021-08-31: 500 mg via INTRAVENOUS
  Filled 2021-08-31: qty 100

## 2021-08-31 NOTE — ED Triage Notes (Signed)
Patient with back pain, diaphoretic before EMS arrival.  Patient was nauseated.  Patient was given '4mg'$  Zofran en route to ED.  Patient denies back pain at this time.  Patient does not have any medical history per EMS.  Patient denies any chest pain or shortness of breath at this time.  Nausea has been relieved.

## 2021-08-31 NOTE — ED Notes (Addendum)
ED TO INPATIENT HANDOFF REPORT  ED Nurse Name and Phone #: Caryl Pina RN [phone]  S Name/Age/Gender Shane Lin 76 y.o. male Room/Bed: 040C/040C  Code Status   Code Status: Full Code  Home/SNF/Other Home Patient oriented to: self, place, time, and situation Is this baseline? Yes   Triage Complete: Triage complete  Chief Complaint Pyelonephritis [N12]  Triage Note Patient with back pain, diaphoretic before EMS arrival.  Patient was nauseated.  Patient was given '4mg'$  Zofran en route to ED.  Patient denies back pain at this time.  Patient does not have any medical history per EMS.  Patient denies any chest pain or shortness of breath at this time.  Nausea has been relieved.     Allergies No Known Allergies  Level of Care/Admitting Diagnosis ED Disposition     ED Disposition  Admit   Condition  --   Comment  Hospital Area: [facility] [[id number]]  Level of Care: Med-Surg [16]  May admit patient to [facility] or [facility] if equivalent level of care is available:: Yes  Covid Evaluation: Asymptomatic - no recent exposure (last 10 days) testing not required  Diagnosis: Pyelonephritis [619509]  Admitting Physician: Vianne Bulls [[id number]]  Attending Physician: Vianne Bulls [[id number]]  Estimated length of stay: past midnight tomorrow  Certification:: I certify this patient will need inpatient services for at least 2 midnights          B Medical/Surgery History History reviewed. No pertinent past medical history. History reviewed. No pertinent surgical history.   A IV Location/Drains/Wounds Patient Lines/Drains/Airways Status     Active Line/Drains/Airways     Name Placement date Placement time Site Days   Peripheral IV 08/31/21 20 G Anterior;Left Forearm 08/31/21  --  Forearm  less than 1   Peripheral IV 08/31/21 20 G Anterior;Right Forearm 08/31/21  0200  Forearm  less than 1            Intake/Output Last 24 hours  Intake/Output  Summary (Last 24 hours) at 08/31/2021 1613 Last data filed at 08/31/2021 1429 Gross per 24 hour  Intake 1498.09 ml  Output 1000 ml  Net 498.09 ml    Labs/Imaging Results for orders placed or performed during the hospital encounter of 08/31/21 (from the past 48 hour(s))  CBC with Differential     Status: Abnormal   Collection Time: 08/31/21 12:42 AM  Result Value Ref Range   WBC 19.0 (H) 4.0 - 10.5 K/uL   RBC 4.70 4.22 - 5.81 MIL/uL   Hemoglobin 15.4 13.0 - 17.0 g/dL   HCT 44.6 39.0 - 52.0 %   MCV 94.9 80.0 - 100.0 fL   MCH 32.8 26.0 - 34.0 pg   MCHC 34.5 30.0 - 36.0 g/dL   RDW 12.2 11.5 - 15.5 %   Platelets 241 150 - 400 K/uL   nRBC 0.0 0.0 - 0.2 %   Neutrophils Relative % 92 %   Neutro Abs 17.4 (H) 1.7 - 7.7 K/uL   Lymphocytes Relative 1 %   Lymphs Abs 0.2 (L) 0.7 - 4.0 K/uL   Monocytes Relative 6 %   Monocytes Absolute 1.1 (H) 0.1 - 1.0 K/uL   Eosinophils Relative 0 %   Eosinophils Absolute 0.0 0.0 - 0.5 K/uL   Basophils Relative 0 %   Basophils Absolute 0.1 0.0 - 0.1 K/uL   Immature Granulocytes 1 %   Abs Immature Granulocytes 0.13 (H) 0.00 - 0.07 K/uL    Comment: Performed at [facility], [place] [address], [place], [place] [address]  Basic metabolic panel     Status: Abnormal   Collection Time: 08/31/21 12:42 AM  Result Value Ref Range   Sodium 141 135 - 145 mmol/L   Potassium 4.2 3.5 - 5.1 mmol/L   Chloride 110 98 - 111 mmol/L   CO2 23 22 - 32 mmol/L   Glucose, Bld 143 (H) 70 - 99 mg/dL    Comment: Glucose reference range applies only to samples taken after fasting for at least 8 hours.   BUN 18 8 - 23 mg/dL   Creatinine, Ser 1.14 0.61 - 1.24 mg/dL   Calcium 8.8 (L) 8.9 - 10.3 mg/dL   GFR, Estimated >60 >60 mL/min    Comment: (NOTE) Calculated using the CKD-EPI Creatinine Equation (2021)    Anion gap 8 5 - 15    Comment: Performed at [facility] [address] [place], [place] [address]  Troponin I (High Sensitivity)     Status: None   Collection  Time: 08/31/21 12:42 AM  Result Value Ref Range   Troponin I (High Sensitivity) 6 <18 ng/L    Comment: (NOTE) Elevated high sensitivity troponin I (hsTnI) values and significant  changes across serial measurements may suggest ACS but many other  chronic and acute conditions are known to elevate hsTnI results.  Refer to the "Links" section for chest pain algorithms and additional  guidance. Performed at [facility], [place] [address], [place], [place] [address]   Urinalysis, Routine w reflex microscopic Urine, Clean Catch     Status: None   Collection Time: 08/31/21  2:55 AM  Result Value Ref Range   Color, Urine YELLOW YELLOW   APPearance CLEAR CLEAR   Specific Gravity, Urine 1.015 1.005 - 1.030   pH 5.0 5.0 - 8.0   Glucose, UA NEGATIVE NEGATIVE mg/dL   Hgb urine dipstick NEGATIVE NEGATIVE   Bilirubin Urine NEGATIVE NEGATIVE   Ketones, ur NEGATIVE NEGATIVE mg/dL   Protein, ur NEGATIVE NEGATIVE mg/dL   Nitrite NEGATIVE NEGATIVE   Leukocytes,Ua NEGATIVE NEGATIVE    Comment: Performed at [facility] [address], [place], [place] [address]  Troponin I (High Sensitivity)     Status: None   Collection Time: 08/31/21  3:02 AM  Result Value Ref Range   Troponin I (High Sensitivity) 5 <18 ng/L    Comment: (NOTE) Elevated high sensitivity troponin I (hsTnI) values and significant  changes across serial measurements may suggest ACS but many other  chronic and acute conditions are known to elevate hsTnI results.  Refer to the "Links" section for chest pain algorithms and additional  guidance. Performed at [facility], [place] [address], [place], [place] [address]   Culture, blood (single)     Status: None (Preliminary result)   Collection Time: 08/31/21  3:02 AM   Specimen: BLOOD  Result Value Ref Range   Specimen Description BLOOD SITE NOT SPECIFIED    Special Requests      BOTTLES DRAWN AEROBIC AND ANAEROBIC Blood Culture adequate volume   Culture      NO  GROWTH < 12 HOURS Performed at [facility], [place] [address], [place], [place] [address]    Report Status PENDING   Lactic acid, plasma     Status: Abnormal   Collection Time: 08/31/21  4:40 AM  Result Value Ref Range   Lactic Acid, Venous 2.4 (HH) 0.5 - 1.9 mmol/L  Comment: CRITICAL RESULT CALLED TO, READ BACK BY AND VERIFIED WITH: Donnelly Stager, RN, [phone] 08/31/21, Courtney Paris Performed at [facility], [place] [address], [place], [place] [address]   Blood culture (routine x 2)     Status: None (Preliminary result)   Collection Time: 08/31/21  4:40 AM   Specimen: BLOOD  Result Value Ref Range   Specimen Description BLOOD RIGHT ANTECUBITAL    Special Requests      BOTTLES DRAWN AEROBIC AND ANAEROBIC Blood Culture adequate volume   Culture      NO GROWTH < 12 HOURS Performed at [facility], [place] [address] [place], [place] [address]    Report Status PENDING   Lactic acid, plasma     Status: Abnormal   Collection Time: 08/31/21  6:17 AM  Result Value Ref Range   Lactic Acid, Venous 2.2 (HH) 0.5 - 1.9 mmol/L    Comment: CRITICAL VALUE NOTED.  VALUE IS CONSISTENT WITH PREVIOUSLY REPORTED AND CALLED VALUE. Performed at [facility], [place] [address], [place], [place] [address]   Lactic acid, plasma     Status: None   Collection Time: 08/31/21 12:42 PM  Result Value Ref Range   Lactic Acid, Venous 1.8 0.5 - 1.9 mmol/L    Comment: Performed at [facility] [address], [place], [place] [address]   CT Renal Stone Study  Result Date: 08/31/2021 CLINICAL DATA:  Flank pain. EXAM: CT ABDOMEN AND PELVIS WITHOUT CONTRAST TECHNIQUE: Multidetector CT imaging of the abdomen and pelvis was performed following the standard protocol without IV contrast. RADIATION DOSE REDUCTION: This exam was performed according to the departmental dose-optimization program which includes automated exposure control, adjustment of the mA and/or kV according to patient size and/or use of  iterative reconstruction technique. COMPARISON:  None Available. FINDINGS: Lower chest: No acute abnormality. Hepatobiliary: No focal liver abnormality is seen. No gallstones, gallbladder wall thickening, or biliary dilatation. Pancreas: Unremarkable. No pancreatic ductal dilatation or surrounding inflammatory changes. Spleen: Normal in size without focal abnormality. Adrenals/Urinary Tract: Adrenal glands are unremarkable. Kidneys are normal, without renal calculi, focal lesion, or hydronephrosis. Mild, bilateral, nonspecific perinephric inflammatory fat stranding is seen. Bladder is unremarkable. Stomach/Bowel: Stomach is within normal limits. Appendix appears normal. No evidence of bowel wall thickening, distention, or inflammatory changes. Noninflamed diverticula are seen within the descending colon. Vascular/Lymphatic: Aortic atherosclerosis. No enlarged abdominal or pelvic lymph nodes. Reproductive: Prostate gland is mildly enlarged. Mild to moderate severity prostate gland calcification is also seen. Other: No abdominal wall hernia or abnormality. No abdominopelvic ascites. Musculoskeletal: A chronic deformity is seen involving the posterior aspect of the inferior endplate of the L4 vertebral body. Degenerative changes are also seen at the levels of L4-L5 and L5-S1. IMPRESSION: 1. Nonspecific perinephric inflammatory fat stranding. Sequelae associated with acute pyelonephritis cannot be excluded. Correlation with urinalysis is recommended. 2. Colonic diverticulosis. 3. Prostatomegaly with prostate gland calcification. Recommend correlation with PSA values. 4. 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 5. Aortic atherosclerosis. Aortic Atherosclerosis (ICD10-I70.0). Electronically Signed   By: Virgina Norfolk M.D.   On: 08/31/2021 03:35   DG Chest 2 View  Result Date: 08/31/2021 CLINICAL DATA:  Back pain and nausea. EXAM: CHEST - 2 VIEW COMPARISON:  None Available. FINDINGS: The heart  size and mediastinal contours are within normal limits. Both lungs are clear. The visualized skeletal structures are unremarkable. IMPRESSION: No active cardiopulmonary disease. Electronically Signed   By: Virgina Norfolk  M.D.   On: 08/31/2021 01:07    Pending Labs Unresulted Labs (From admission, onward)     Start     Ordered   09/07/21 0500  Creatinine, serum  (enoxaparin (LOVENOX)    CrCl >/= 30 ml/min)  Weekly,   R     Comments: while on enoxaparin therapy    08/31/21 0659   09/01/21 8921  Basic metabolic panel  Daily,   R      08/31/21 0659   09/01/21 0500  CBC  Daily,   R      08/31/21 0659   08/31/21 0347  Blood culture (routine x 2)  BLOOD CULTURE X 2,   R (with STAT occurrences)      08/31/21 0346   08/31/21 0346  Urine Culture  Once,   URGENT       Question Answer Comment  Indication Dysuria   Patient immune status Normal   Release to patient Immediate      08/31/21 0346            Vitals/Pain Today's Vitals   08/31/21 1200 08/31/21 1330 08/31/21 1400 08/31/21 1430  BP: 136/69 133/76 138/73 137/69  Pulse: 66 71 64 61  Resp: 19 (!) '21 17 17  '$ Temp:      TempSrc:      SpO2: 97% 97% 97% 96%  PainSc:        Isolation Precautions No active isolations  Medications Medications  enoxaparin (LOVENOX) injection 40 mg (40 mg Subcutaneous Given 08/31/21 1022)  acetaminophen (TYLENOL) tablet 650 mg (650 mg Oral Given 08/31/21 1607)    Or  acetaminophen (TYLENOL) suppository 650 mg ( Rectal See Alternative 08/31/21 1607)  senna-docusate (Senokot-S) tablet 1 tablet (has no administration in time range)  ondansetron (ZOFRAN) tablet 4 mg (has no administration in time range)    Or  ondansetron (ZOFRAN) injection 4 mg (has no administration in time range)  cefTRIAXone (ROCEPHIN) 2 g in sodium chloride 0.9 % 100 mL IVPB (0 g Intravenous Stopped 08/31/21 1429)  ceFEPIme (MAXIPIME) 2 g in sodium chloride 0.9 % 100 mL IVPB (0 g Intravenous Stopped 08/31/21 0345)  metroNIDAZOLE  (FLAGYL) IVPB 500 mg (0 mg Intravenous Stopped 08/31/21 0447)  vancomycin (VANCOCIN) IVPB 1000 mg/200 mL premix (0 mg Intravenous Stopped 08/31/21 0614)  lactated ringers bolus 1,000 mL (0 mLs Intravenous Stopped 08/31/21 0900)    Mobility walks with person assist Low fall risk   Focused Assessments Renal Assessment Handoff:     R Recommendations: See Admitting Provider Note  Report given to:   Additional Notes: Pt denies back pain at this time, tylenol given for chronic leg pain, no difficulty urinating. Using urinal with previous nurse, possibly able to ambulate to bathroom pt states "I am just not able to walk long distances". A&Ox4

## 2021-08-31 NOTE — ED Provider Triage Note (Signed)
Emergency Medicine Provider Triage Evaluation Note  Shane Lin , a 76 y.o. male  was evaluated in triage.  Pt complains of back pain.  Had transient thoracic back pain earlier, dull in nature.  Pain was short lived but intense and has resolved at this time.  States he was concerned for MI.  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  Review of Systems  Positive: Back pain Negative: fever  Physical Exam  BP 134/77   Pulse 79   Temp 97.8 F (36.6 C) (Oral)   Resp 16   SpO2 98%  Gen:   Awake, no distress   Resp:  Normal effort  MSK:   Moves extremities without difficulty  Other:  Back is non-tender to palpation, appears comfortable, pulses intact extremities x4  Medical Decision Making  Medically screening exam initiated at 12:37 AM.  Appropriate orders placed.  Shane Lin was informed that the remainder of the evaluation will be completed by another provider, this initial triage assessment does not replace that evaluation, and the importance of remaining in the ED until their evaluation is complete.  Atraumatic back pain, transient and self resolved.  VSS currently.  Pulses intact x4 extremities.  Expresses concern for MI.  EKG NSR without ischemia.  Labs and CXR ordered.   Larene Pickett, PA-C 08/31/21 0040

## 2021-08-31 NOTE — ED Provider Notes (Signed)
[name]   CSN: [id number] Arrival date & time: 08/31/21  0001     History  Chief Complaint  Patient presents with   Back Pain    Shane Lin is a 76 y.o. male.  The history is provided by the patient.  Flank Pain This is a new problem. The current episode started 1 to 2 hours ago. The problem occurs constantly. The problem has not changed since onset.Pertinent negatives include no chest pain, no abdominal pain, no headaches and no shortness of breath. Nothing aggravates the symptoms. Nothing relieves the symptoms. He has tried nothing for the symptoms. The treatment provided no relief.  Flank pain with chills and diaphoresis.       Home Medications Prior to Admission medications   Not on File      Allergies    Patient has no known allergies.    Review of Systems   Review of Systems  Constitutional:  Positive for chills and diaphoresis. Negative for fever.  HENT:  Negative for facial swelling.   Respiratory:  Negative for shortness of breath.   Cardiovascular:  Negative for chest pain.  Gastrointestinal:  Negative for abdominal pain.  Genitourinary:  Positive for flank pain.  Neurological:  Negative for headaches.  All other systems reviewed and are negative.   Physical Exam Updated Vital Signs BP 136/70   Pulse 86   Temp 97.8 F (36.6 C) (Oral)   Resp 20   SpO2 94%  Physical Exam Vitals and nursing note reviewed.  Constitutional:      General: He is not in acute distress.    Appearance: He is well-developed.  HENT:     Head: Normocephalic and atraumatic.     Right Ear: External ear normal.     Nose: Nose normal.  Eyes:     Conjunctiva/sclera: Conjunctivae normal.     Pupils: Pupils are equal, round, and reactive to light.     Comments: Normal appearance  Cardiovascular:     Rate and Rhythm: Normal rate and regular rhythm.     Pulses: Normal pulses.     Heart sounds: Normal heart sounds.   Pulmonary:     Effort: Pulmonary effort is normal. No respiratory distress.     Breath sounds: Normal breath sounds.  Abdominal:     General: Bowel sounds are normal. There is no distension.     Palpations: Abdomen is soft. There is no mass.     Tenderness: There is no abdominal tenderness. There is no guarding or rebound.     Comments: Cva tenderness  Genitourinary:    Comments: No CVA tenderness Musculoskeletal:        General: Normal range of motion.     Cervical back: Normal range of motion and neck supple.  Skin:    General: Skin is warm and dry.     Capillary Refill: Capillary refill takes less than 2 seconds.     Findings: No rash.  Neurological:     General: No focal deficit present.     Mental Status: He is alert and oriented to person, place, and time.     Deep Tendon Reflexes: Reflexes normal.  Psychiatric:        Mood and Affect: Mood normal.        Behavior: Behavior normal.     ED Results / Procedures / Treatments   Labs (all labs ordered are listed, but only abnormal results are displayed) \ Results for orders  placed or performed during the hospital encounter of 08/31/21  CBC with Differential  Result Value Ref Range   WBC 19.0 (H) 4.0 - 10.5 K/uL   RBC 4.70 4.22 - 5.81 MIL/uL   Hemoglobin 15.4 13.0 - 17.0 g/dL   HCT 44.6 39.0 - 52.0 %   MCV 94.9 80.0 - 100.0 fL   MCH 32.8 26.0 - 34.0 pg   MCHC 34.5 30.0 - 36.0 g/dL   RDW 12.2 11.5 - 15.5 %   Platelets 241 150 - 400 K/uL   nRBC 0.0 0.0 - 0.2 %   Neutrophils Relative % 92 %   Neutro Abs 17.4 (H) 1.7 - 7.7 K/uL   Lymphocytes Relative 1 %   Lymphs Abs 0.2 (L) 0.7 - 4.0 K/uL   Monocytes Relative 6 %   Monocytes Absolute 1.1 (H) 0.1 - 1.0 K/uL   Eosinophils Relative 0 %   Eosinophils Absolute 0.0 0.0 - 0.5 K/uL   Basophils Relative 0 %   Basophils Absolute 0.1 0.0 - 0.1 K/uL   Immature Granulocytes 1 %   Abs Immature Granulocytes 0.13 (H) 0.00 - 0.07 K/uL  Basic metabolic panel  Result Value Ref  Range   Sodium 141 135 - 145 mmol/L   Potassium 4.2 3.5 - 5.1 mmol/L   Chloride 110 98 - 111 mmol/L   CO2 23 22 - 32 mmol/L   Glucose, Bld 143 (H) 70 - 99 mg/dL   BUN 18 8 - 23 mg/dL   Creatinine, Ser 1.14 0.61 - 1.24 mg/dL   Calcium 8.8 (L) 8.9 - 10.3 mg/dL   GFR, Estimated >60 >60 mL/min   Anion gap 8 5 - 15  Urinalysis, Routine w reflex microscopic Urine, Clean Catch  Result Value Ref Range   Color, Urine YELLOW YELLOW   APPearance CLEAR CLEAR   Specific Gravity, Urine 1.015 1.005 - 1.030   pH 5.0 5.0 - 8.0   Glucose, UA NEGATIVE NEGATIVE mg/dL   Hgb urine dipstick NEGATIVE NEGATIVE   Bilirubin Urine NEGATIVE NEGATIVE   Ketones, ur NEGATIVE NEGATIVE mg/dL   Protein, ur NEGATIVE NEGATIVE mg/dL   Nitrite NEGATIVE NEGATIVE   Leukocytes,Ua NEGATIVE NEGATIVE  Troponin I (High Sensitivity)  Result Value Ref Range   Troponin I (High Sensitivity) 6 <18 ng/L  Troponin I (High Sensitivity)  Result Value Ref Range   Troponin I (High Sensitivity) 5 <18 ng/L   CT Renal Stone Study  Result Date: 08/31/2021 CLINICAL DATA:  Flank pain. EXAM: CT ABDOMEN AND PELVIS WITHOUT CONTRAST TECHNIQUE: Multidetector CT imaging of the abdomen and pelvis was performed following the standard protocol without IV contrast. RADIATION DOSE REDUCTION: This exam was performed according to the departmental dose-optimization program which includes automated exposure control, adjustment of the mA and/or kV according to patient size and/or use of iterative reconstruction technique. COMPARISON:  None Available. FINDINGS: Lower chest: No acute abnormality. Hepatobiliary: No focal liver abnormality is seen. No gallstones, gallbladder wall thickening, or biliary dilatation. Pancreas: Unremarkable. No pancreatic ductal dilatation or surrounding inflammatory changes. Spleen: Normal in size without focal abnormality. Adrenals/Urinary Tract: Adrenal glands are unremarkable. Kidneys are normal, without renal calculi, focal lesion,  or hydronephrosis. Mild, bilateral, nonspecific perinephric inflammatory fat stranding is seen. Bladder is unremarkable. Stomach/Bowel: Stomach is within normal limits. Appendix appears normal. No evidence of bowel wall thickening, distention, or inflammatory changes. Noninflamed diverticula are seen within the descending colon. Vascular/Lymphatic: Aortic atherosclerosis. No enlarged abdominal or pelvic lymph nodes. Reproductive: Prostate gland is mildly enlarged. Mild  to moderate severity prostate gland calcification is also seen. Other: No abdominal wall hernia or abnormality. No abdominopelvic ascites. Musculoskeletal: A chronic deformity is seen involving the posterior aspect of the inferior endplate of the L4 vertebral body. Degenerative changes are also seen at the levels of L4-L5 and L5-S1. IMPRESSION: 1. Nonspecific perinephric inflammatory fat stranding. Sequelae associated with acute pyelonephritis cannot be excluded. Correlation with urinalysis is recommended. 2. Colonic diverticulosis. 3. Prostatomegaly with prostate gland calcification. Recommend correlation with PSA values. 4. 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 5. Aortic atherosclerosis. Aortic Atherosclerosis (ICD10-I70.0). Electronically Signed   By: Aram Candela M.D.   On: 08/31/2021 03:35   DG Chest 2 View  Result Date: 08/31/2021 CLINICAL DATA:  Back pain and nausea. EXAM: CHEST - 2 VIEW COMPARISON:  None Available. FINDINGS: The heart size and mediastinal contours are within normal limits. Both lungs are clear. The visualized skeletal structures are unremarkable. IMPRESSION: No active cardiopulmonary disease. Electronically Signed   By: Aram Candela M.D.   On: 08/31/2021 01:07     EKG EKG Interpretation  Date/Time:  Saturday August 31 2021 00:05:46 EDT Ventricular Rate:  79 PR Interval:  140 QRS Duration: 86 QT Interval:  394 QTC Calculation: 451 R Axis:   78 Text Interpretation: Normal sinus  rhythm Confirmed by Nicanor Alcon, Duru Reiger ([id number]) on 08/31/2021 2:38:07 AM  Radiology CT Renal Stone Study  Result Date: 08/31/2021 CLINICAL DATA:  Flank pain. EXAM: CT ABDOMEN AND PELVIS WITHOUT CONTRAST TECHNIQUE: Multidetector CT imaging of the abdomen and pelvis was performed following the standard protocol without IV contrast. RADIATION DOSE REDUCTION: This exam was performed according to the departmental dose-optimization program which includes automated exposure control, adjustment of the mA and/or kV according to patient size and/or use of iterative reconstruction technique. COMPARISON:  None Available. FINDINGS: Lower chest: No acute abnormality. Hepatobiliary: No focal liver abnormality is seen. No gallstones, gallbladder wall thickening, or biliary dilatation. Pancreas: Unremarkable. No pancreatic ductal dilatation or surrounding inflammatory changes. Spleen: Normal in size without focal abnormality. Adrenals/Urinary Tract: Adrenal glands are unremarkable. Kidneys are normal, without renal calculi, focal lesion, or hydronephrosis. Mild, bilateral, nonspecific perinephric inflammatory fat stranding is seen. Bladder is unremarkable. Stomach/Bowel: Stomach is within normal limits. Appendix appears normal. No evidence of bowel wall thickening, distention, or inflammatory changes. Noninflamed diverticula are seen within the descending colon. Vascular/Lymphatic: Aortic atherosclerosis. No enlarged abdominal or pelvic lymph nodes. Reproductive: Prostate gland is mildly enlarged. Mild to moderate severity prostate gland calcification is also seen. Other: No abdominal wall hernia or abnormality. No abdominopelvic ascites. Musculoskeletal: A chronic deformity is seen involving the posterior aspect of the inferior endplate of the L4 vertebral body. Degenerative changes are also seen at the levels of L4-L5 and L5-S1. IMPRESSION: 1. Nonspecific perinephric inflammatory fat stranding. Sequelae associated with acute  pyelonephritis cannot be excluded. Correlation with urinalysis is recommended. 2. Colonic diverticulosis. 3. Prostatomegaly with prostate gland calcification. Recommend correlation with PSA values. 4. 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 5. Aortic atherosclerosis. Aortic Atherosclerosis (ICD10-I70.0). Electronically Signed   By: Aram Candela M.D.   On: 08/31/2021 03:35   DG Chest 2 View  Result Date: 08/31/2021 CLINICAL DATA:  Back pain and nausea. EXAM: CHEST - 2 VIEW COMPARISON:  None Available. FINDINGS: The heart size and mediastinal contours are within normal limits. Both lungs are clear. The visualized skeletal structures are unremarkable. IMPRESSION: No active cardiopulmonary disease. Electronically Signed   By: Demetrius Revel.D.  On: 08/31/2021 01:07    Procedures Procedures    Medications Ordered in ED Medications  lactated ringers infusion ( Intravenous New Bag/Given 08/31/21 0305)  metroNIDAZOLE (FLAGYL) IVPB 500 mg (500 mg Intravenous New Bag/Given 08/31/21 0346)  vancomycin (VANCOCIN) IVPB 1000 mg/200 mL premix (has no administration in time range)  ceFEPIme (MAXIPIME) 2 g in sodium chloride 0.9 % 100 mL IVPB (has no administration in time range)  vancomycin (VANCOCIN) IVPB 1000 mg/200 mL premix (has no administration in time range)  ceFEPIme (MAXIPIME) 2 g in sodium chloride 0.9 % 100 mL IVPB (0 g Intravenous Stopped 08/31/21 0345)    ED Course/ Medical Decision Making/ A&P                           Medical Decision Making Flank pain with chills and sweating   Amount and/or Complexity of Data Reviewed External Data Reviewed: notes.    Details: previous notes reviewed Labs: ordered.    Details: all labs reviewed:  elevated white count 19 with left shift, normal hemoglobin 15.4 , normal platelet count 241K.  Sodium 141with normal potassium 4.2 and normal creatinine 1.14  cultures ordered Radiology: ordered and independent interpretation  performed.    Details: CXR negative by my reading perinephric stranding CT  Risk Prescription drug management. Decision regarding hospitalization. Risk Details: Sirs criteria met and sepsis protocol ordered.     Final Clinical Impression(s) / ED Diagnoses Final diagnoses:  Pyelonephritis   The patient appears reasonably stabilized for admission considering the current resources, flow, and capabilities available in the ED at this time, and I doubt any other [facility] requiring further screening and/or treatment in the ED prior to admission.  Rx / DC Orders ED Discharge Orders     None         Jakyle Petrucelli, MD 08/31/21 [phone]

## 2021-08-31 NOTE — Sepsis Progress Note (Addendum)
Monitoring for the code sepsis protocol.  Message sent to MD via secure chat requesting orders for lactic acid & blood cultures.

## 2021-08-31 NOTE — Progress Notes (Signed)
NEW ADMISSION NOTE New Admission Note:   Arrival Method: Patient arrived to unit in w/c. Mental Orientation: alert and oriented x 4. Telemetry: N/A Assessment: Completed Skin: warm, dry and intact. IV: L FA, R FA Pain: N/a Tubes: N/A Safety Measures: Safety Fall Prevention Plan has been given, discussed and signed Admission: in progress [facility] Orientation: Patient has been orientated to the room, unit and staff.  Family: None.  Orders have been reviewed and implemented. Will continue to monitor the patient. Call light has been placed within reach and bed alarm has been activated.   Amaryllis Dyke, RN

## 2021-08-31 NOTE — Progress Notes (Signed)
  Progress Note   Patient: Shane Lin:[id number] DOB: 05/17/1945 DOA: 08/31/2021     0 DOS: the patient was seen and examined on 08/31/2021   Brief hospital course: 76 y.o. male with medical history significant for chronic low back pain, now presenting to the emergency department for evaluation of chills, sweats, flank pain, and nausea.  The patient reports that he was in his usual state of health, had an uneventful day, and then at approximately 7 or 8 PM last night experienced dull ache in the bilateral flanks, different than his typical low back pain, and this was accompanied by chills, diaphoresis, and nausea without vomiting.  He had some mild dysuria but thought this could be from dehydration.  Denies any hematuria.  Denies any cough, shortness of breath, or any rash or wounds.  He was treated for a preseptal cellulitis on the right proximately 2 months ago which completely resolved.   Upon arrival to the ED, patient is found to be afebrile and saturating well on room air with stable blood pressure.  Chemistry panel was unremarkable, troponin normal x2, chest x-ray negative for acute cardiopulmonary disease, and CBC notable for WBC of 19,000.  Lactic acid is 2.4.  CT renal stone study is notable for mild bilateral perinephric inflammatory fat stranding and prostamegaly with calcified prostate gland.  Blood and urine cultures were collected and the patient was treated with broad-spectrum antibiotics.  Assessment and Plan: 1. Pyelonephritis with sepsis present on admit - Presents with chills, diaphoresis, flank pain, and mild dysuria and noted to have WBC of 19,000 with perinephric stranding on CT  - cont empiric rocephin as tolerated -Urine cx and blood cx pending -Recheck bmet and CBC in AM  Lactic acidosis -Suspect secondary to presenting pyelonephritis -Improving with hydration     Subjective: Continues with back pains this AM  Physical Exam: Vitals:   08/31/21 0730 08/31/21  0800 08/31/21 1045 08/31/21 1200  BP: 132/71 133/69 126/60 136/69  Pulse: 86 71 70 66  Resp: (!) 24 (!) '22 15 19  '$ Temp:      TempSrc:      SpO2: 97% 96% 97% 97%   General exam: Awake, laying in bed, in nad Respiratory system: Normal respiratory effort, no wheezing Cardiovascular system: regular rate, s1, s2 Gastrointestinal system: Soft, nondistended, positive BS Central nervous system: CN2-12 grossly intact, strength intact Extremities: Perfused, no clubbing Skin: Normal skin turgor, no notable skin lesions seen Psychiatry: Mood normal // no visual hallucinations   Data Reviewed:  Labs reviewed: Na 141, K 4.2, Cr 1.14   Family Communication: Pt in room, family at bedside  Disposition: Status is: Inpatient Remains inpatient appropriate because: Severity of illness  Planned Discharge Destination: Home    Author: Marylu Lund, MD 08/31/2021 12:28 PM  For on call review [email or web address].

## 2021-08-31 NOTE — H&P (Signed)
History and Physical    Shane Lin KZL:[id number] DOB: Dec 05, 1945 DOA: 08/31/2021  PCP: [facility], [facility]   Patient coming from: Home   Chief Complaint: Chills, sweats, flank pain, nausea   HPI: Shane Lin is a pleasant 76 y.o. male with medical history significant for chronic low back pain, now presenting to the emergency department for evaluation of chills, sweats, flank pain, and nausea.  The patient reports that he was in his usual state of health, had an uneventful day, and then at approximately 7 or 8 PM last night experienced dull ache in the bilateral flanks, different than his typical low back pain, and this was accompanied by chills, diaphoresis, and nausea without vomiting.  He had some mild dysuria but thought this could be from dehydration.  Denies any hematuria.  Denies any cough, shortness of breath, or any rash or wounds.  He was treated for a preseptal cellulitis on the right proximately 2 months ago which completely resolved.  ED Course: Upon arrival to the ED, patient is found to be afebrile and saturating well on room air with stable blood pressure.  Chemistry panel was unremarkable, troponin normal x2, chest x-ray negative for acute cardiopulmonary disease, and CBC notable for WBC of 19,000.  Lactic acid is 2.4.  CT renal stone study is notable for mild bilateral perinephric inflammatory fat stranding and prostamegaly with calcified prostate gland.  Blood and urine cultures were collected and the patient was treated with broad-spectrum antibiotics.  Review of Systems:  All other systems reviewed and apart from HPI, are negative.  History reviewed. No pertinent past medical history.  History reviewed. No pertinent surgical history.  Social History:   reports that he has never smoked. He has never used smokeless tobacco. No history on file for alcohol use and drug use.  No Known Allergies  History reviewed. No pertinent family  history.   Prior to Admission medications   Not on File    Physical Exam: Vitals:   08/31/21 0302 08/31/21 0330 08/31/21 0400 08/31/21 0430  BP: (!) 142/69 (!) 146/82 136/70 126/70  Pulse: 73 84 86 77  Resp: 15 (!) 21 20 (!) 21  Temp:      TempSrc:      SpO2: 97% 100% 94% 95%     Constitutional: NAD, calm  Eyes: PERTLA, lids and conjunctivae normal ENMT: Mucous membranes are moist. Posterior pharynx clear of any exudate or lesions.   Neck: supple, no masses  Respiratory: clear to auscultation bilaterally, no wheezing, no crackles. No accessory muscle use.  Cardiovascular: S1 & S2 heard, regular rate and rhythm. No extremity edema.   Abdomen: No distension, soft. Bowel sounds active.  Musculoskeletal: no clubbing / cyanosis. No joint deformity upper and lower extremities.   Skin: no significant rashes, lesions, ulcers. Warm, dry, well-perfused. Neurologic: CN 2-12 grossly intact. Moving all extremities. Alert and oriented.  Psychiatric: Pleasant. Cooperative.    Labs and Imaging on Admission: I have personally reviewed following labs and imaging studies  CBC: Recent Labs  Lab 08/31/21 0042  WBC 19.0*  NEUTROABS 17.4*  HGB 15.4  HCT 44.6  MCV 94.9  PLT 390   Basic Metabolic Panel: Recent Labs  Lab 08/31/21 0042  NA 141  K 4.2  CL 110  CO2 23  GLUCOSE 143*  BUN 18  CREATININE 1.14  CALCIUM 8.8*   GFR: CrCl cannot be calculated (Unknown ideal weight.). Liver Function Tests: No results for input(s): "AST", "ALT", "ALKPHOS", "BILITOT", "  PROT", "ALBUMIN" in the last 168 hours. No results for input(s): "LIPASE", "AMYLASE" in the last 168 hours. No results for input(s): "AMMONIA" in the last 168 hours. Coagulation Profile: No results for input(s): "INR", "PROTIME" in the last 168 hours. Cardiac Enzymes: No results for input(s): "CKTOTAL", "CKMB", "CKMBINDEX", "TROPONINI" in the last 168 hours. BNP (last 3 results) No results for input(s): "PROBNP" in the  last 8760 hours. HbA1C: No results for input(s): "HGBA1C" in the last 72 hours. CBG: No results for input(s): "GLUCAP" in the last 168 hours. Lipid Profile: No results for input(s): "CHOL", "HDL", "LDLCALC", "TRIG", "CHOLHDL", "LDLDIRECT" in the last 72 hours. Thyroid Function Tests: No results for input(s): "TSH", "T4TOTAL", "FREET4", "T3FREE", "THYROIDAB" in the last 72 hours. Anemia Panel: No results for input(s): "VITAMINB12", "FOLATE", "FERRITIN", "TIBC", "IRON", "RETICCTPCT" in the last 72 hours. Urine analysis:    Component Value Date/Time   COLORURINE YELLOW 08/31/2021 0255   APPEARANCEUR CLEAR 08/31/2021 0255   LABSPEC 1.015 08/31/2021 0255   PHURINE 5.0 08/31/2021 0255   GLUCOSEU NEGATIVE 08/31/2021 0255   HGBUR NEGATIVE 08/31/2021 0255   BILIRUBINUR NEGATIVE 08/31/2021 0255   KETONESUR NEGATIVE 08/31/2021 0255   PROTEINUR NEGATIVE 08/31/2021 0255   NITRITE NEGATIVE 08/31/2021 0255   LEUKOCYTESUR NEGATIVE 08/31/2021 0255   Sepsis Labs: '@LABRCNTIP'$ (procalcitonin:4,lacticidven:4) )No results found for this or any previous visit (from the past 240 hour(s)).   Radiological Exams on Admission: CT Renal Stone Study  Result Date: 08/31/2021 CLINICAL DATA:  Flank pain. EXAM: CT ABDOMEN AND PELVIS WITHOUT CONTRAST TECHNIQUE: Multidetector CT imaging of the abdomen and pelvis was performed following the standard protocol without IV contrast. RADIATION DOSE REDUCTION: This exam was performed according to the departmental dose-optimization program which includes automated exposure control, adjustment of the mA and/or kV according to patient size and/or use of iterative reconstruction technique. COMPARISON:  None Available. FINDINGS: Lower chest: No acute abnormality. Hepatobiliary: No focal liver abnormality is seen. No gallstones, gallbladder wall thickening, or biliary dilatation. Pancreas: Unremarkable. No pancreatic ductal dilatation or surrounding inflammatory changes. Spleen:  Normal in size without focal abnormality. Adrenals/Urinary Tract: Adrenal glands are unremarkable. Kidneys are normal, without renal calculi, focal lesion, or hydronephrosis. Mild, bilateral, nonspecific perinephric inflammatory fat stranding is seen. Bladder is unremarkable. Stomach/Bowel: Stomach is within normal limits. Appendix appears normal. No evidence of bowel wall thickening, distention, or inflammatory changes. Noninflamed diverticula are seen within the descending colon. Vascular/Lymphatic: Aortic atherosclerosis. No enlarged abdominal or pelvic lymph nodes. Reproductive: Prostate gland is mildly enlarged. Mild to moderate severity prostate gland calcification is also seen. Other: No abdominal wall hernia or abnormality. No abdominopelvic ascites. Musculoskeletal: A chronic deformity is seen involving the posterior aspect of the inferior endplate of the L4 vertebral body. Degenerative changes are also seen at the levels of L4-L5 and L5-S1. IMPRESSION: 1. Nonspecific perinephric inflammatory fat stranding. Sequelae associated with acute pyelonephritis cannot be excluded. Correlation with urinalysis is recommended. 2. Colonic diverticulosis. 3. Prostatomegaly with prostate gland calcification. Recommend correlation with PSA values. 4. 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 5. Aortic atherosclerosis. Aortic Atherosclerosis (ICD10-I70.0). Electronically Signed   By: Virgina Norfolk M.D.   On: 08/31/2021 03:35   DG Chest 2 View  Result Date: 08/31/2021 CLINICAL DATA:  Back pain and nausea. EXAM: CHEST - 2 VIEW COMPARISON:  None Available. FINDINGS: The heart size and mediastinal contours are within normal limits. Both lungs are clear. The visualized skeletal structures are unremarkable. IMPRESSION: No active cardiopulmonary disease. Electronically Signed  By: Virgina Norfolk M.D.   On: 08/31/2021 01:07    EKG: Independently reviewed. Sinus rhythm.   Assessment/Plan   1.  Pyelonephritis  - Presents with chills, diaphoresis, flank pain, and mild dysuria and noted to have WBC of 19,000 with perinephric stranding on CT  - qSOFA score is 0, BP is stable, and patient does not appear to be septic on admission  - Blood and urine cultures were collected in the ED and he was started on antibiotics  - Treat with Rocephin, follow cultures and clinical course    DVT prophylaxis: Lovenox   Code Status: Full  Level of Care: Level of care: Med-Surg Family Communication: None present  Disposition Plan:  Patient is from: home  Anticipated d/c is to: home  Anticipated d/c date is: 09/02/21  Patient currently: Pending cultures, transition to oral medications  Consults called: none  Admission status: Inpatient     Vianne Bulls, MD [facility]  08/31/2021, 7:01 AM

## 2021-08-31 NOTE — Hospital Course (Signed)
76 y.o. male with medical history significant for chronic low back pain, now presenting to the emergency department for evaluation of chills, sweats, flank pain, and nausea.  The patient reports that he was in his usual state of health, had an uneventful day, and then at approximately 7 or 8 PM last night experienced dull ache in the bilateral flanks, different than his typical low back pain, and this was accompanied by chills, diaphoresis, and nausea without vomiting.  He had some mild dysuria but thought this could be from dehydration.  Denies any hematuria.  Denies any cough, shortness of breath, or any rash or wounds.  He was treated for a preseptal cellulitis on the right proximately 2 months ago which completely resolved.   Upon arrival to the ED, patient is found to be afebrile and saturating well on room air with stable blood pressure.  Chemistry panel was unremarkable, troponin normal x2, chest x-ray negative for acute cardiopulmonary disease, and CBC notable for WBC of 19,000.  Lactic acid is 2.4.  CT renal stone study is notable for mild bilateral perinephric inflammatory fat stranding and prostamegaly with calcified prostate gland.  Blood and urine cultures were collected and the patient was treated with broad-spectrum antibiotics.

## 2021-08-31 NOTE — Progress Notes (Signed)
Pharmacy Antibiotic Note  Shane Lin is a 76 y.o. male admitted on 08/31/2021 with sepsis.  Pharmacy has been consulted for Vancomycin/Cefepime dosing. WBC elevated. Renal function ok.   Plan: Vancomycin 1000 mg IV q24h >>>Estimated AUC: 508 Cefepime 2g IV q12h Trend WBC, temp, renal function  F/U infectious work-up Drug levels as indicated   Temp (24hrs), Avg:97.8 F (36.6 C), Min:97.8 F (36.6 C), Max:97.8 F (36.6 C)  Recent Labs  Lab 08/31/21 0042  WBC 19.0*  CREATININE 1.14    CrCl cannot be calculated (Unknown ideal weight.).    No Known Allergies  Narda Bonds, PharmD, BCPS Clinical Pharmacist Phone: [phone]

## 2021-09-01 DIAGNOSIS — N12 Tubulo-interstitial nephritis, not specified as acute or chronic: Secondary | ICD-10-CM | POA: Diagnosis not present

## 2021-09-01 LAB — CBC
HCT: 40.9 % (ref 39.0–52.0)
Hemoglobin: 14.1 g/dL (ref 13.0–17.0)
MCH: 32.9 pg (ref 26.0–34.0)
MCHC: 34.5 g/dL (ref 30.0–36.0)
MCV: 95.6 fL (ref 80.0–100.0)
Platelets: 212 10*3/uL (ref 150–400)
RBC: 4.28 MIL/uL (ref 4.22–5.81)
RDW: 12.5 % (ref 11.5–15.5)
WBC: 8.6 10*3/uL (ref 4.0–10.5)
nRBC: 0 % (ref 0.0–0.2)

## 2021-09-01 LAB — COMPREHENSIVE METABOLIC PANEL
ALT: 15 U/L (ref 0–44)
AST: 17 U/L (ref 15–41)
Albumin: 3.4 g/dL — ABNORMAL LOW (ref 3.5–5.0)
Alkaline Phosphatase: 53 U/L (ref 38–126)
Anion gap: 8 (ref 5–15)
BUN: 14 mg/dL (ref 8–23)
CO2: 25 mmol/L (ref 22–32)
Calcium: 8.5 mg/dL — ABNORMAL LOW (ref 8.9–10.3)
Chloride: 108 mmol/L (ref 98–111)
Creatinine, Ser: 1.19 mg/dL (ref 0.61–1.24)
GFR, Estimated: >60 mL/min (ref >60)
Glucose, Bld: 143 mg/dL — ABNORMAL HIGH (ref 70–99)
Potassium: 3.8 mmol/L (ref 3.5–5.1)
Sodium: 141 mmol/L (ref 135–145)
Total Bilirubin: 0.7 mg/dL (ref 0.3–1.2)
Total Protein: 6.5 g/dL (ref 6.5–8.1)

## 2021-09-01 LAB — URINE CULTURE
Culture: NO GROWTH
Special Requests: NORMAL

## 2021-09-01 MED ORDER — CEFDINIR 300 MG PO CAPS: 300.0000 mg | ORAL_CAPSULE | Freq: Two times a day (BID) | ORAL | 0 refills | Status: AC

## 2021-09-01 NOTE — Care Management CC44 (Signed)
Condition Code 44 Documentation Completed  Patient Details  Name: CASSIDY TABET MRN: [id number] Date of Birth: 06-24-1945   Condition Code 44 given:  Yes Patient signature on Condition Code 44 notice:  Yes Documentation of 2 MD's agreement:  Yes Code 44 added to claim:  Yes    Bartholomew Crews, RN 09/01/2021, 1:08 PM

## 2021-09-01 NOTE — Care Management Obs Status (Signed)
[facility] NOTIFICATION   Patient Details  Name: BURHANUDDIN KOHLMANN MRN: [id number] Date of Birth: Mar 21, 1945   Medicare Observation Status Notification Given:  Yes    Bartholomew Crews, RN 09/01/2021, 1:08 PM

## 2021-09-01 NOTE — Progress Notes (Signed)
Pt stated he is exhausted and does not want to be disturbed tonight. Pt stated he wants his labs drawn in the am and to not wake him up during the night for them. This RN informed lab what pt said and put a note on the pt's door per patient request. This RN also informed pt that labs do need to be drawn and he stated to wait until the am.   Foster Simpson [facility]

## 2021-09-01 NOTE — TOC Transition Note (Signed)
Transition of Care [facility]) - CM/SW Discharge Note   Patient Details  Name: Shane Lin MRN: [id number] Date of Birth: 1945/09/16  Transition of Care [facility]) CM/SW Contact:  Bartholomew Crews, RN Phone Number: [phone] 09/01/2021, 12:45 PM   Clinical Narrative:     Spoke with patient at the bedside to discuss post acute transition. PTA home alone. Patient will make arrangements from transportation home. Patient confirmed that he does not have a part D plan - discussed pharmacy options, including single care and goodrx. No further TOC  needs identified at this time.   Final next level of care: Home/Self Care Barriers to Discharge: No Barriers Identified   Patient Goals and CMS Choice Patient states their goals for this hospitalization and ongoing recovery are:: return home CMS Medicare.gov Compare Post Acute Care list provided to:: Patient Choice offered to / list presented to : NA  Discharge Placement                       Discharge Plan and Services                                     Social Determinants of Health (SDOH) Interventions     Readmission Risk Interventions     No data to display

## 2021-09-01 NOTE — Progress Notes (Signed)
Shane Lin to be discharged Home per MD order. Discussed prescriptions and follow up appointments with the patient. Prescriptions given to patient; medication list explained in detail. Patient verbalized understanding.  Skin clean, dry and intact without evidence of skin break down, no evidence of skin tears noted. IV catheter discontinued intact. Site without signs and symptoms of complications. Dressing and pressure applied. Pt denies pain at the site currently. No complaints noted.  Patient free of lines, drains, and wounds.   An After Visit Summary (AVS) was printed and given to the patient. Patient escorted via wheelchair, and discharged home via private auto.  Amaryllis Dyke, RN

## 2021-09-01 NOTE — Discharge Summary (Signed)
Physician Discharge Summary   Patient: Shane Lin MRN: [id number] DOB: [birth year]  Admit date:     08/31/2021  Discharge date: 09/01/21  Discharge Physician: Marylu Lund   PCP: [facility]   Recommendations at discharge:    Follow up with PCP in 1-2 weeks  Discharge Diagnoses: Principal Problem:   Pyelonephritis  Resolved Problems:   * No resolved hospital problems. *  Hospital Course: 76 y.o. male with medical history significant for chronic low back pain, now presenting to the emergency department for evaluation of chills, sweats, flank pain, and nausea.  The patient reports that he was in his usual state of health, had an uneventful day, and then at approximately 7 or 8 PM last night experienced dull ache in the bilateral flanks, different than his typical low back pain, and this was accompanied by chills, diaphoresis, and nausea without vomiting.  He had some mild dysuria but thought this could be from dehydration.  Denies any hematuria.  Denies any cough, shortness of breath, or any rash or wounds.  He was treated for a preseptal cellulitis on the right proximately 2 months ago which completely resolved.   Upon arrival to the ED, patient is found to be afebrile and saturating well on room air with stable blood pressure.  Chemistry panel was unremarkable, troponin normal x2, chest x-ray negative for acute cardiopulmonary disease, and CBC notable for WBC of 19,000.  Lactic acid is 2.4.  CT renal stone study is notable for mild bilateral perinephric inflammatory fat stranding and prostamegaly with calcified prostate gland.  Blood and urine cultures were collected and the patient was treated with broad-spectrum antibiotics.  Assessment and Plan: 1. Pyelonephritis with sepsis present on admit - Presents with chills, diaphoresis, flank pain, and mild dysuria and noted to have WBC of 19,000 with perinephric stranding on CT  - was cont on empiric rocephin as  tolerated -Urine cx and blood cx without growth -Leukocytosis normalized -Would complete course of omnicef to complete 10 days   Lactic acidosis -Suspect secondary to presenting pyelonephritis -Normalized with IVF        Consultants:  Procedures performed:   Disposition: Home Diet recommendation:  Regular diet DISCHARGE MEDICATION: Allergies as of 09/01/2021   No Known Allergies      Medication List     TAKE these medications    acetaminophen 500 MG tablet Commonly known as: TYLENOL Take 250 mg by mouth daily as needed (pain).   aspirin 325 MG tablet Take 162.5-325 mg by mouth daily as needed (pain).   cefdinir 300 MG capsule Commonly known as: OMNICEF Take 1 capsule (300 mg total) by mouth 2 (two) times daily for 8 days.   latanoprost 0.005 % ophthalmic solution Commonly known as: XALATAN Place 1 drop into both eyes at bedtime.   VITAMIN B-12 PO Take 1 tablet by mouth 2 (two) times a week.   VITAMIN B6 PO Take 1 tablet by mouth 2 (two) times a week.        Follow-up Information     [facility] Follow up.   Specialty: Family Medicine Why: Hospital follow up Contact information: [address] [place] [address] [phone]                Discharge Exam: There were no vitals filed for this visit. General exam: Awake, laying in bed, in nad Respiratory system: Normal respiratory effort, no wheezing Cardiovascular system: regular rate, s1, s2  Gastrointestinal system: Soft, nondistended, positive BS Central nervous system: CN2-12 grossly intact, strength intact Extremities: Perfused, no clubbing Skin: Normal skin turgor, no notable skin lesions seen Psychiatry: Mood normal // no visual hallucinations   Condition at discharge: improving  The results of significant diagnostics from this hospitalization (including imaging, microbiology, ancillary and laboratory) are listed below for reference.    Imaging Studies: CT Renal Stone Study  Result Date: 08/31/2021 CLINICAL DATA:  Flank pain. EXAM: CT ABDOMEN AND PELVIS WITHOUT CONTRAST TECHNIQUE: Multidetector CT imaging of the abdomen and pelvis was performed following the standard protocol without IV contrast. RADIATION DOSE REDUCTION: This exam was performed according to the departmental dose-optimization program which includes automated exposure control, adjustment of the mA and/or kV according to patient size and/or use of iterative reconstruction technique. COMPARISON:  None Available. FINDINGS: Lower chest: No acute abnormality. Hepatobiliary: No focal liver abnormality is seen. No gallstones, gallbladder wall thickening, or biliary dilatation. Pancreas: Unremarkable. No pancreatic ductal dilatation or surrounding inflammatory changes. Spleen: Normal in size without focal abnormality. Adrenals/Urinary Tract: Adrenal glands are unremarkable. Kidneys are normal, without renal calculi, focal lesion, or hydronephrosis. Mild, bilateral, nonspecific perinephric inflammatory fat stranding is seen. Bladder is unremarkable. Stomach/Bowel: Stomach is within normal limits. Appendix appears normal. No evidence of bowel wall thickening, distention, or inflammatory changes. Noninflamed diverticula are seen within the descending colon. Vascular/Lymphatic: Aortic atherosclerosis. No enlarged abdominal or pelvic lymph nodes. Reproductive: Prostate gland is mildly enlarged. Mild to moderate severity prostate gland calcification is also seen. Other: No abdominal wall hernia or abnormality. No abdominopelvic ascites. Musculoskeletal: A chronic deformity is seen involving the posterior aspect of the inferior endplate of the L4 vertebral body. Degenerative changes are also seen at the levels of L4-L5 and L5-S1. IMPRESSION: 1. Nonspecific perinephric inflammatory fat stranding. Sequelae associated with acute pyelonephritis cannot be excluded. Correlation with urinalysis is  recommended. 2. Colonic diverticulosis. 3. Prostatomegaly with prostate gland calcification. Recommend correlation with PSA values. 4. 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 5. Aortic atherosclerosis. Aortic Atherosclerosis (ICD10-I70.0). Electronically Signed   By: Virgina Norfolk M.D.   On: 08/31/2021 03:35   DG Chest 2 View  Result Date: 08/31/2021 CLINICAL DATA:  Back pain and nausea. EXAM: CHEST - 2 VIEW COMPARISON:  None Available. FINDINGS: The heart size and mediastinal contours are within normal limits. Both lungs are clear. The visualized skeletal structures are unremarkable. IMPRESSION: No active cardiopulmonary disease. Electronically Signed   By: Virgina Norfolk M.D.   On: 08/31/2021 01:07    Microbiology: Results for orders placed or performed during the hospital encounter of 08/31/21  Urine Culture     Status: None   Collection Time: 08/31/21  2:55 AM   Specimen: Urine, Clean Catch  Result Value Ref Range Status   Specimen Description URINE, CLEAN CATCH  Final   Special Requests Normal  Final   Culture   Final    NO GROWTH Performed at [facility], [place] [address], [place], [place] [address]    Report Status 09/01/2021 FINAL  Final  Culture, blood (single)     Status: None (Preliminary result)   Collection Time: 08/31/21  3:02 AM   Specimen: BLOOD  Result Value Ref Range Status   Specimen Description BLOOD SITE NOT SPECIFIED  Final   Special Requests   Final    BOTTLES DRAWN AEROBIC AND ANAEROBIC Blood Culture adequate volume   Culture   Final    NO GROWTH 1 DAY Performed at  [facility], [place] [address], [place], [place] [address]    Report Status PENDING  Incomplete  Blood culture (routine x 2)     Status: None (Preliminary result)   Collection Time: 08/31/21  4:40 AM   Specimen: BLOOD  Result Value Ref Range Status   Specimen Description BLOOD RIGHT ANTECUBITAL  Final   Special Requests   Final    BOTTLES DRAWN AEROBIC AND  ANAEROBIC Blood Culture adequate volume   Culture   Final    NO GROWTH 1 DAY Performed at [facility], [place] [address] [place], [place] [address]    Report Status PENDING  Incomplete    Labs: CBC: Recent Labs  Lab 08/31/21 0042 09/01/21 0756  WBC 19.0* 8.6  NEUTROABS 17.4*  --   HGB 15.4 14.1  HCT 44.6 40.9  MCV 94.9 95.6  PLT 241 802   Basic Metabolic Panel: Recent Labs  Lab 08/31/21 0042 09/01/21 0756  NA 141 141  K 4.2 3.8  CL 110 108  CO2 23 25  GLUCOSE 143* 143*  BUN 18 14  CREATININE 1.14 1.19  CALCIUM 8.8* 8.5*   Liver Function Tests: Recent Labs  Lab 09/01/21 0756  AST 17  ALT 15  ALKPHOS 53  BILITOT 0.7  PROT 6.5  ALBUMIN 3.4*   CBG: No results for input(s): "GLUCAP" in the last 168 hours.  Discharge time spent: less than 30 minutes.  Signed: Marylu Lund, MD [facility] 09/01/2021

## 2021-09-03 LAB — CULTURE, BLOOD (SINGLE)

## 2021-09-04 DIAGNOSIS — Z86006 Personal history of melanoma in-situ: Secondary | ICD-10-CM | POA: Diagnosis not present

## 2021-09-04 DIAGNOSIS — Z85828 Personal history of other malignant neoplasm of skin: Secondary | ICD-10-CM | POA: Diagnosis not present

## 2021-09-04 DIAGNOSIS — L821 Other seborrheic keratosis: Secondary | ICD-10-CM | POA: Diagnosis not present

## 2021-09-04 DIAGNOSIS — L814 Other melanin hyperpigmentation: Secondary | ICD-10-CM | POA: Diagnosis not present

## 2021-09-04 DIAGNOSIS — Z08 Encounter for follow-up examination after completed treatment for malignant neoplasm: Secondary | ICD-10-CM | POA: Diagnosis not present

## 2021-09-04 DIAGNOSIS — D225 Melanocytic nevi of trunk: Secondary | ICD-10-CM | POA: Diagnosis not present

## 2021-09-04 LAB — CULTURE, BLOOD (SINGLE): Culture: NO GROWTH

## 2021-09-05 LAB — CULTURE, BLOOD (ROUTINE X 2)
Culture: NO GROWTH
Special Requests: ADEQUATE

## 2021-09-18 DIAGNOSIS — E7800 Pure hypercholesterolemia, unspecified: Secondary | ICD-10-CM | POA: Diagnosis not present

## 2021-09-18 DIAGNOSIS — Z79899 Other long term (current) drug therapy: Secondary | ICD-10-CM | POA: Diagnosis not present

## 2021-09-18 DIAGNOSIS — Z125 Encounter for screening for malignant neoplasm of prostate: Secondary | ICD-10-CM | POA: Diagnosis not present

## 2021-09-18 DIAGNOSIS — N12 Tubulo-interstitial nephritis, not specified as acute or chronic: Secondary | ICD-10-CM | POA: Diagnosis not present

## 2021-09-18 DIAGNOSIS — Z0000 Encounter for general adult medical examination without abnormal findings: Secondary | ICD-10-CM | POA: Diagnosis not present

## 2021-09-18 DIAGNOSIS — I700 Atherosclerosis of aorta: Secondary | ICD-10-CM | POA: Diagnosis not present

## 2021-10-12 DIAGNOSIS — D72829 Elevated white blood cell count, unspecified: Secondary | ICD-10-CM | POA: Diagnosis not present

## 2021-10-12 DIAGNOSIS — R350 Frequency of micturition: Secondary | ICD-10-CM | POA: Diagnosis not present

## 2021-11-25 DIAGNOSIS — H401131 Primary open-angle glaucoma, bilateral, mild stage: Secondary | ICD-10-CM | POA: Diagnosis not present

## 2021-11-25 DIAGNOSIS — H2513 Age-related nuclear cataract, bilateral: Secondary | ICD-10-CM | POA: Diagnosis not present

## 2021-11-25 DIAGNOSIS — H18593 Other hereditary corneal dystrophies, bilateral: Secondary | ICD-10-CM | POA: Diagnosis not present

## 2021-11-28 DIAGNOSIS — Z23 Encounter for immunization: Secondary | ICD-10-CM | POA: Diagnosis not present

## 2021-12-31 DIAGNOSIS — Z23 Encounter for immunization: Secondary | ICD-10-CM | POA: Diagnosis not present

## 2022-04-04 DIAGNOSIS — H25813 Combined forms of age-related cataract, bilateral: Secondary | ICD-10-CM | POA: Diagnosis not present

## 2022-04-04 DIAGNOSIS — H401131 Primary open-angle glaucoma, bilateral, mild stage: Secondary | ICD-10-CM | POA: Diagnosis not present

## 2022-06-24 DIAGNOSIS — H15102 Unspecified episcleritis, left eye: Secondary | ICD-10-CM | POA: Diagnosis not present

## 2022-06-24 DIAGNOSIS — H18593 Other hereditary corneal dystrophies, bilateral: Secondary | ICD-10-CM | POA: Diagnosis not present

## 2022-06-24 DIAGNOSIS — H0100B Unspecified blepharitis left eye, upper and lower eyelids: Secondary | ICD-10-CM | POA: Diagnosis not present

## 2022-06-24 DIAGNOSIS — H0100A Unspecified blepharitis right eye, upper and lower eyelids: Secondary | ICD-10-CM | POA: Diagnosis not present

## 2022-07-01 DIAGNOSIS — H0100A Unspecified blepharitis right eye, upper and lower eyelids: Secondary | ICD-10-CM | POA: Diagnosis not present

## 2022-07-01 DIAGNOSIS — H0100B Unspecified blepharitis left eye, upper and lower eyelids: Secondary | ICD-10-CM | POA: Diagnosis not present

## 2022-07-01 DIAGNOSIS — H15102 Unspecified episcleritis, left eye: Secondary | ICD-10-CM | POA: Diagnosis not present

## 2022-08-04 DIAGNOSIS — H31003 Unspecified chorioretinal scars, bilateral: Secondary | ICD-10-CM | POA: Diagnosis not present

## 2022-08-04 DIAGNOSIS — H401131 Primary open-angle glaucoma, bilateral, mild stage: Secondary | ICD-10-CM | POA: Diagnosis not present

## 2022-08-04 DIAGNOSIS — H43813 Vitreous degeneration, bilateral: Secondary | ICD-10-CM | POA: Diagnosis not present

## 2022-08-04 DIAGNOSIS — H2513 Age-related nuclear cataract, bilateral: Secondary | ICD-10-CM | POA: Diagnosis not present

## 2022-09-08 DIAGNOSIS — D225 Melanocytic nevi of trunk: Secondary | ICD-10-CM | POA: Diagnosis not present

## 2022-09-08 DIAGNOSIS — L814 Other melanin hyperpigmentation: Secondary | ICD-10-CM | POA: Diagnosis not present

## 2022-09-08 DIAGNOSIS — Z86006 Personal history of melanoma in-situ: Secondary | ICD-10-CM | POA: Diagnosis not present

## 2022-09-08 DIAGNOSIS — Z08 Encounter for follow-up examination after completed treatment for malignant neoplasm: Secondary | ICD-10-CM | POA: Diagnosis not present

## 2022-09-08 DIAGNOSIS — L821 Other seborrheic keratosis: Secondary | ICD-10-CM | POA: Diagnosis not present

## 2022-09-08 DIAGNOSIS — Z85828 Personal history of other malignant neoplasm of skin: Secondary | ICD-10-CM | POA: Diagnosis not present

## 2022-09-26 DIAGNOSIS — Z79899 Other long term (current) drug therapy: Secondary | ICD-10-CM | POA: Diagnosis not present

## 2022-09-26 DIAGNOSIS — R7301 Impaired fasting glucose: Secondary | ICD-10-CM | POA: Diagnosis not present

## 2022-09-26 DIAGNOSIS — Z0001 Encounter for general adult medical examination with abnormal findings: Secondary | ICD-10-CM | POA: Diagnosis not present

## 2022-09-26 DIAGNOSIS — I700 Atherosclerosis of aorta: Secondary | ICD-10-CM | POA: Diagnosis not present

## 2022-09-26 DIAGNOSIS — E7800 Pure hypercholesterolemia, unspecified: Secondary | ICD-10-CM | POA: Diagnosis not present

## 2022-10-01 DIAGNOSIS — D492 Neoplasm of unspecified behavior of bone, soft tissue, and skin: Secondary | ICD-10-CM | POA: Diagnosis not present

## 2022-10-01 DIAGNOSIS — L570 Actinic keratosis: Secondary | ICD-10-CM | POA: Diagnosis not present

## 2022-10-01 DIAGNOSIS — L578 Other skin changes due to chronic exposure to nonionizing radiation: Secondary | ICD-10-CM | POA: Diagnosis not present

## 2022-10-01 DIAGNOSIS — L821 Other seborrheic keratosis: Secondary | ICD-10-CM | POA: Diagnosis not present

## 2022-11-27 DIAGNOSIS — H0100B Unspecified blepharitis left eye, upper and lower eyelids: Secondary | ICD-10-CM | POA: Diagnosis not present

## 2022-11-27 DIAGNOSIS — H0100A Unspecified blepharitis right eye, upper and lower eyelids: Secondary | ICD-10-CM | POA: Diagnosis not present

## 2022-11-27 DIAGNOSIS — H401131 Primary open-angle glaucoma, bilateral, mild stage: Secondary | ICD-10-CM | POA: Diagnosis not present

## 2022-11-27 DIAGNOSIS — H2513 Age-related nuclear cataract, bilateral: Secondary | ICD-10-CM | POA: Diagnosis not present

## 2022-12-01 DIAGNOSIS — Z23 Encounter for immunization: Secondary | ICD-10-CM | POA: Diagnosis not present

## 2022-12-11 DIAGNOSIS — H401131 Primary open-angle glaucoma, bilateral, mild stage: Secondary | ICD-10-CM | POA: Diagnosis not present

## 2022-12-26 DIAGNOSIS — H2513 Age-related nuclear cataract, bilateral: Secondary | ICD-10-CM | POA: Diagnosis not present

## 2022-12-26 DIAGNOSIS — H401131 Primary open-angle glaucoma, bilateral, mild stage: Secondary | ICD-10-CM | POA: Diagnosis not present

## 2023-04-06 DIAGNOSIS — D485 Neoplasm of uncertain behavior of skin: Secondary | ICD-10-CM | POA: Diagnosis not present

## 2023-04-06 DIAGNOSIS — L538 Other specified erythematous conditions: Secondary | ICD-10-CM | POA: Diagnosis not present

## 2023-04-06 DIAGNOSIS — D492 Neoplasm of unspecified behavior of bone, soft tissue, and skin: Secondary | ICD-10-CM | POA: Diagnosis not present

## 2023-04-06 DIAGNOSIS — L820 Inflamed seborrheic keratosis: Secondary | ICD-10-CM | POA: Diagnosis not present

## 2023-06-11 DIAGNOSIS — Z23 Encounter for immunization: Secondary | ICD-10-CM | POA: Diagnosis not present

## 2023-06-25 DIAGNOSIS — H0100B Unspecified blepharitis left eye, upper and lower eyelids: Secondary | ICD-10-CM | POA: Diagnosis not present

## 2023-06-25 DIAGNOSIS — H2513 Age-related nuclear cataract, bilateral: Secondary | ICD-10-CM | POA: Diagnosis not present

## 2023-06-25 DIAGNOSIS — H0100A Unspecified blepharitis right eye, upper and lower eyelids: Secondary | ICD-10-CM | POA: Diagnosis not present

## 2023-06-25 DIAGNOSIS — H401131 Primary open-angle glaucoma, bilateral, mild stage: Secondary | ICD-10-CM | POA: Diagnosis not present

## 2023-09-09 DIAGNOSIS — L538 Other specified erythematous conditions: Secondary | ICD-10-CM | POA: Diagnosis not present

## 2023-09-09 DIAGNOSIS — D225 Melanocytic nevi of trunk: Secondary | ICD-10-CM | POA: Diagnosis not present

## 2023-09-09 DIAGNOSIS — D492 Neoplasm of unspecified behavior of bone, soft tissue, and skin: Secondary | ICD-10-CM | POA: Diagnosis not present

## 2023-09-09 DIAGNOSIS — L570 Actinic keratosis: Secondary | ICD-10-CM | POA: Diagnosis not present

## 2023-09-09 DIAGNOSIS — Z86006 Personal history of melanoma in-situ: Secondary | ICD-10-CM | POA: Diagnosis not present

## 2023-09-09 DIAGNOSIS — L814 Other melanin hyperpigmentation: Secondary | ICD-10-CM | POA: Diagnosis not present

## 2023-09-09 DIAGNOSIS — Z08 Encounter for follow-up examination after completed treatment for malignant neoplasm: Secondary | ICD-10-CM | POA: Diagnosis not present

## 2023-09-09 DIAGNOSIS — L821 Other seborrheic keratosis: Secondary | ICD-10-CM | POA: Diagnosis not present

## 2023-09-09 DIAGNOSIS — Z85828 Personal history of other malignant neoplasm of skin: Secondary | ICD-10-CM | POA: Diagnosis not present

## 2023-10-02 DIAGNOSIS — E7800 Pure hypercholesterolemia, unspecified: Secondary | ICD-10-CM | POA: Diagnosis not present

## 2023-10-02 DIAGNOSIS — Z79899 Other long term (current) drug therapy: Secondary | ICD-10-CM | POA: Diagnosis not present

## 2023-10-02 DIAGNOSIS — Z0000 Encounter for general adult medical examination without abnormal findings: Secondary | ICD-10-CM | POA: Diagnosis not present

## 2023-10-02 DIAGNOSIS — R7301 Impaired fasting glucose: Secondary | ICD-10-CM | POA: Diagnosis not present

## 2023-10-02 DIAGNOSIS — Z23 Encounter for immunization: Secondary | ICD-10-CM | POA: Diagnosis not present

## 2023-10-02 DIAGNOSIS — Z1331 Encounter for screening for depression: Secondary | ICD-10-CM | POA: Diagnosis not present

## 2023-12-21 DIAGNOSIS — Z23 Encounter for immunization: Secondary | ICD-10-CM | POA: Diagnosis not present

## 2023-12-25 DIAGNOSIS — H401131 Primary open-angle glaucoma, bilateral, mild stage: Secondary | ICD-10-CM | POA: Diagnosis not present

## 2023-12-25 DIAGNOSIS — H2513 Age-related nuclear cataract, bilateral: Secondary | ICD-10-CM | POA: Diagnosis not present

## 2024-02-10 DIAGNOSIS — H401131 Primary open-angle glaucoma, bilateral, mild stage: Secondary | ICD-10-CM | POA: Diagnosis not present

## 2024-02-10 DIAGNOSIS — H43813 Vitreous degeneration, bilateral: Secondary | ICD-10-CM | POA: Diagnosis not present

## 2024-02-10 DIAGNOSIS — H18593 Other hereditary corneal dystrophies, bilateral: Secondary | ICD-10-CM | POA: Diagnosis not present

## 2024-02-10 DIAGNOSIS — H524 Presbyopia: Secondary | ICD-10-CM | POA: Diagnosis not present

## 2024-02-10 DIAGNOSIS — H2513 Age-related nuclear cataract, bilateral: Secondary | ICD-10-CM | POA: Diagnosis not present

## 2024-02-10 DIAGNOSIS — H25013 Cortical age-related cataract, bilateral: Secondary | ICD-10-CM | POA: Diagnosis not present

## 2024-02-10 DIAGNOSIS — H5203 Hypermetropia, bilateral: Secondary | ICD-10-CM | POA: Diagnosis not present
